# Patient Record
Sex: Female | Born: 1985 | Race: Asian | Hispanic: No | Marital: Married | Smoking: Never smoker
Health system: Southern US, Community
[De-identification: ages and names within clinical notes are randomized; demographics above are authoritative.]

## PROBLEM LIST (undated history)

## (undated) DIAGNOSIS — Z789 Other specified health status: Secondary | ICD-10-CM

## (undated) HISTORY — PX: NO PAST SURGERIES: SHX2092

---

## 2013-12-09 ENCOUNTER — Ambulatory Visit (INDEPENDENT_AMBULATORY_CARE_PROVIDER_SITE_OTHER): Payer: Managed Care, Other (non HMO) | Admitting: Physician Assistant

## 2013-12-09 VITALS — BP 94/78 | HR 102 | Temp 98.2°F | Resp 16 | Ht 61.0 in | Wt 149.0 lb

## 2013-12-09 DIAGNOSIS — Z23 Encounter for immunization: Secondary | ICD-10-CM | POA: Diagnosis not present

## 2013-12-09 NOTE — Progress Notes (Signed)
   Subjective:    Patient ID: [name], female    DOB: 1985/09/09, 28 y.o.   MRN: [id number]  HPI 28 year old female presents for Tdap. She will be attending [employer] to study [place] language. 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  States she does not need any other immunizations.  Last tetanus unknown.  Patient is otherwise doing well with no other concerns today.     Review of Systems  Respiratory: Negative for cough.   Skin: Negative for rash.  Neurological: Negative for headaches.       Objective:   Physical Exam  Constitutional: She is oriented to person, place, and time. She appears well-developed and well-nourished.  HENT:  Head: Normocephalic and atraumatic.  Eyes: Conjunctivae are normal.  Neck: Normal range of motion.  Cardiovascular: Normal rate.   Pulmonary/Chest: Effort normal.  Neurological: She is alert and oriented to person, place, and time.  Psychiatric: She has a normal mood and affect. Her behavior is normal. Judgment and thought content normal.          Assessment & Plan:  Need for Tdap vaccination - Plan: Tdap vaccine greater than or equal to 7yo IM  Tdap updated Follow up as needed.

## 2014-01-24 ENCOUNTER — Ambulatory Visit (INDEPENDENT_AMBULATORY_CARE_PROVIDER_SITE_OTHER): Payer: Managed Care, Other (non HMO) | Admitting: Family Medicine

## 2014-01-24 VITALS — BP 105/71 | HR 96 | Temp 98.8°F | Resp 16 | Ht 61.5 in | Wt 150.2 lb

## 2014-01-24 DIAGNOSIS — K5289 Other specified noninfective gastroenteritis and colitis: Secondary | ICD-10-CM

## 2014-01-24 DIAGNOSIS — R609 Edema, unspecified: Secondary | ICD-10-CM

## 2014-01-24 DIAGNOSIS — R509 Fever, unspecified: Secondary | ICD-10-CM

## 2014-01-24 DIAGNOSIS — R197 Diarrhea, unspecified: Secondary | ICD-10-CM

## 2014-01-24 DIAGNOSIS — K529 Noninfective gastroenteritis and colitis, unspecified: Secondary | ICD-10-CM

## 2014-01-24 LAB — POCT URINALYSIS DIPSTICK
BILIRUBIN UA: NEGATIVE
Glucose, UA: NEGATIVE
Ketones, UA: NEGATIVE
Leukocytes, UA: NEGATIVE
Nitrite, UA: NEGATIVE
PH UA: 5.5
Protein, UA: NEGATIVE
RBC UA: NEGATIVE
Spec Grav, UA: 1.015
UROBILINOGEN UA: 0.2

## 2014-01-24 LAB — POCT UA - MICROSCOPIC ONLY
Casts, Ur, LPF, POC: NEGATIVE
Crystals, Ur, HPF, POC: NEGATIVE
MUCUS UA: NEGATIVE
YEAST UA: NEGATIVE

## 2014-01-24 LAB — POCT URINE PREGNANCY: Preg Test, Ur: NEGATIVE

## 2014-01-24 MED ORDER — LOPERAMIDE HCL 2 MG PO TABS: 2.0000 mg | ORAL_TABLET | Freq: Four times a day (QID) | ORAL | Status: DC | PRN

## 2014-01-24 NOTE — Patient Instructions (Signed)
Diarrhea  Diarrhea is frequent loose and watery bowel movements. It can cause you to feel weak and dehydrated. Dehydration can cause you to become tired and thirsty, have a dry mouth, and have decreased urination that often is dark yellow. Diarrhea is a sign of another problem, most often an infection that will not last long. In most cases, diarrhea typically lasts 2-3 days. However, it can last longer if it is a sign of something more serious. It is important to treat your diarrhea as directed by your caregive to lessen or prevent future episodes of diarrhea.  CAUSES   Some common causes include:   Gastrointestinal infections caused by viruses, bacteria, or parasites.   Food poisoning or food allergies.   Certain medicines, such as antibiotics, chemotherapy, and laxatives.   Artificial sweeteners and fructose.   Digestive disorders.  HOME CARE INSTRUCTIONS   Ensure adequate fluid intake (hydration): have 1 cup (8 oz) of fluid for each diarrhea episode. Avoid fluids that contain simple sugars or sports drinks, fruit juices, whole milk products, and sodas. Your urine should be clear or pale yellow if you are drinking enough fluids. Hydrate with an oral rehydration solution that you can purchase at pharmacies, retail stores, and online. You can prepare an oral rehydration solution at home by mixing the following ingredients together:    - tsp table salt.    tsp baking soda.    tsp salt substitute containing potassium chloride.   1  tablespoons sugar.   1 L (34 oz) of water.   Certain foods and beverages may increase the speed at which food moves through the gastrointestinal (GI) tract. These foods and beverages should be avoided and include:   Caffeinated and alcoholic beverages.   High-fiber foods, such as raw fruits and vegetables, nuts, seeds, and whole grain breads and cereals.   Foods and beverages sweetened with sugar alcohols, such as xylitol, sorbitol, and mannitol.   Some foods may be well  tolerated and may help thicken stool including:   Starchy foods, such as rice, toast, pasta, low-sugar cereal, oatmeal, grits, baked potatoes, crackers, and bagels.   Bananas.   Applesauce.   Add probiotic-rich foods to help increase healthy bacteria in the GI tract, such as yogurt and fermented milk products.   Wash your hands well after each diarrhea episode.   Only take over-the-counter or prescription medicines as directed by your caregiver.   Take a warm bath to relieve any burning or pain from frequent diarrhea episodes.  SEEK IMMEDIATE MEDICAL CARE IF:    You are unable to keep fluids down.   You have persistent vomiting.   You have blood in your stool, or your stools are black and tarry.   You do not urinate in 6-8 hours, or there is only a small amount of very dark urine.   You have abdominal pain that increases or localizes.   You have weakness, dizziness, confusion, or lightheadedness.   You have a severe headache.   Your diarrhea gets worse or does not get better.   You have a fever or persistent symptoms for more than 2-3 days.   You have a fever and your symptoms suddenly get worse.  MAKE SURE YOU:    Understand these instructions.   Will watch your condition.   Will get help right away if you are not doing well or get worse.  Document Released: 06/14/2002 Document Revised: 06/10/2012 Document Reviewed: 03/01/2012  ExitCare Patient Information 2015 [employer]. This information   is not intended to replace advice given to you by your health care provider. Make sure you discuss any questions you have with your health care provider.

## 2014-01-24 NOTE — Progress Notes (Signed)
Subjective:  This chart was scribed for Angela SorensonEva Annalisia Ingber, MD by Charline BillsEssence Rivers, ED Scribe. The patient was seen in room 5. Patient's care was started at 8:48 PM.   Patient ID: Angela Rivers, female    DOB: 08-28-1985, 28 y.o.   MRN: [id number]  Chief Complaint  Patient presents with  . Fever    fever in the mornings and diarrhea that started yesterday.  no cough but has lots of phlegm that is clear x 2 wks.     HPI HPI Comments: Angela Rivers is a 28 y.o. female who presents to the [facility] complaining of subjective fever onset this morning. Office temperature 98.8 F. She reports multiple episodes of associated chills and sweats over the past few weeks, diarrhea onset yesterday, rhinorrhea, productive cough with clear phlegm onset 2 weeks ago, leg swelling over the past few weeks. She also reports associated central abdominal pain onset this morning. Pt reports normal periods. She denies sleep disturbances, appetite change, vomiting, indigestion, urinary symptoms, ear pain, light-headedness, dizziness. She has not tried any medications, herbs, vitamins or supplements. Pt does not eat lunch. She ate a normal breakfast this morning.   History reviewed. No pertinent past medical history. No current outpatient prescriptions on file prior to visit.   No current facility-administered medications on file prior to visit.   No Known Allergies  Review of Systems  Constitutional: Positive for fever, chills and diaphoresis. Negative for appetite change.  HENT: Positive for rhinorrhea. Negative for ear pain.   Respiratory: Positive for cough.   Cardiovascular: Positive for leg swelling.  Gastrointestinal: Positive for abdominal pain and diarrhea. Negative for vomiting.  Genitourinary: Negative for dysuria, urgency, frequency and difficulty urinating.  Neurological: Negative for dizziness and light-headedness.  Psychiatric/Behavioral: Negative for sleep disturbance.    Triage  Vitals: BP 105/71  Pulse 96  Temp(Src) 98.8 F (37.1 C) (Oral)  Resp 16  Ht 5' 1.5" (1.562 m)  Wt 150 lb 3.2 oz (68.13 kg)  BMI 27.92 kg/m2  SpO2 100%  LMP 12/28/2013    Objective:   Physical Exam  Nursing note and vitals reviewed. Constitutional: She is oriented to person, place, and time. She appears well-developed and well-nourished.  HENT:  Head: Normocephalic and atraumatic.  Right Ear: Tympanic membrane normal.  Left Ear: Tympanic membrane normal.  Nose: Nose normal.  Mouth/Throat: Oropharynx is clear and moist.  Eyes: Conjunctivae and EOM are normal.  Neck: Neck supple. No thyromegaly present.  Cardiovascular: Normal rate, regular rhythm, S1 normal, S2 normal and normal heart sounds.   Pulses:      Dorsalis pedis pulses are 2+ on the right side, and 2+ on the left side.  Pulmonary/Chest: Effort normal and breath sounds normal.  Abdominal: Soft. Bowel sounds are normal. She exhibits no distension and no mass. There is no hepatosplenomegaly. There is no tenderness.  Musculoskeletal: Normal range of motion. She exhibits edema.  Trace lower extremity edema equal bilaterally  Lymphadenopathy:    She has cervical adenopathy.  Anterior cervical adenopathy on L, none on R  Neurological: She is alert and oriented to person, place, and time.  Skin: Skin is warm and dry.  Psychiatric: She has a normal mood and affect. Her behavior is normal.     Negative orthostatics Assessment & Plan:   Diarrhea - Plan: Comprehensive metabolic panel, TSH  Edema - Plan: TSH  Fever, unspecified - Plan: CBC with Differential, POCT UA - Microscopic Only, POCT urinalysis dipstick, POCT urine pregnancy  Noninfectious gastroenteritis, unspecified  Meds ordered this encounter  Medications  . loperamide (IMODIUM A-D) 2 MG tablet    Sig: Take 1 tablet (2 mg total) by mouth 4 (four) times daily as needed for diarrhea or loose stools.    Dispense:  30 tablet    Refill:  0    I personally  performed the services described in this documentation, which was scribed in my presence. The recorded information has been reviewed and considered, and addended by me as needed.  Angela Sorenson, MD MPH

## 2014-01-25 LAB — COMPREHENSIVE METABOLIC PANEL
ALK PHOS: 60 U/L (ref 39–117)
ALT: 8 U/L (ref 0–35)
AST: 12 U/L (ref 0–37)
Albumin: 4.1 g/dL (ref 3.5–5.2)
BILIRUBIN TOTAL: 0.2 mg/dL (ref 0.2–1.2)
BUN: 9 mg/dL (ref 6–23)
CO2: 22 mEq/L (ref 19–32)
CREATININE: 0.55 mg/dL (ref 0.50–1.10)
Calcium: 8.8 mg/dL (ref 8.4–10.5)
Chloride: 105 mEq/L (ref 96–112)
GLUCOSE: 84 mg/dL (ref 70–99)
Potassium: 4.4 mEq/L (ref 3.5–5.3)
Sodium: 138 mEq/L (ref 135–145)
Total Protein: 7.3 g/dL (ref 6.0–8.3)

## 2014-01-25 LAB — CBC WITH DIFFERENTIAL/PLATELET
BASOS PCT: 1 % (ref 0–1)
Basophils Absolute: 0.1 10*3/uL (ref 0.0–0.1)
Eosinophils Absolute: 0.1 10*3/uL (ref 0.0–0.7)
Eosinophils Relative: 2 % (ref 0–5)
HCT: 34.2 % — ABNORMAL LOW (ref 36.0–46.0)
Hemoglobin: 11.3 g/dL — ABNORMAL LOW (ref 12.0–15.0)
Lymphocytes Relative: 54 % — ABNORMAL HIGH (ref 12–46)
Lymphs Abs: 4 10*3/uL (ref 0.7–4.0)
MCH: 25.8 pg — ABNORMAL LOW (ref 26.0–34.0)
MCHC: 33 g/dL (ref 30.0–36.0)
MCV: 78.1 fL (ref 78.0–100.0)
Monocytes Absolute: 0.4 10*3/uL (ref 0.1–1.0)
Monocytes Relative: 6 % (ref 3–12)
NEUTROS PCT: 37 % — AB (ref 43–77)
Neutro Abs: 2.7 10*3/uL (ref 1.7–7.7)
PLATELETS: 390 10*3/uL (ref 150–400)
RBC: 4.38 MIL/uL (ref 3.87–5.11)
RDW: 14.4 % (ref 11.5–15.5)
WBC: 7.4 10*3/uL (ref 4.0–10.5)

## 2014-01-25 LAB — TSH: TSH: 1.523 u[IU]/mL (ref 0.350–4.500)

## 2014-01-28 ENCOUNTER — Telehealth: Payer: Self-pay | Admitting: *Deleted

## 2014-01-28 NOTE — Telephone Encounter (Signed)
Pt's husband called in regards to recent lab results. Doesn't look like they have been reviewed yet .

## 2014-01-31 NOTE — Telephone Encounter (Signed)
All normal other than mildly anemia so start high iron diet or mvi w/ iron in it.  Will send letter with list of high iron foods attached.

## 2014-02-01 NOTE — Telephone Encounter (Signed)
Mailed iron rich foods list, unable to leave message, vm not set up.

## 2014-02-02 NOTE — Telephone Encounter (Signed)
Unable to lv msg on either number, vm not set up

## 2014-04-21 ENCOUNTER — Ambulatory Visit (INDEPENDENT_AMBULATORY_CARE_PROVIDER_SITE_OTHER): Payer: Managed Care, Other (non HMO) | Admitting: Family Medicine

## 2014-04-21 VITALS — BP 100/68 | HR 99 | Temp 98.3°F | Resp 18 | Ht 62.5 in | Wt 155.0 lb

## 2014-04-21 DIAGNOSIS — G4489 Other headache syndrome: Secondary | ICD-10-CM

## 2014-04-21 DIAGNOSIS — J029 Acute pharyngitis, unspecified: Secondary | ICD-10-CM

## 2014-04-21 LAB — POCT UA - MICROSCOPIC ONLY
Bacteria, U Microscopic: NEGATIVE
CASTS, UR, LPF, POC: NEGATIVE
CRYSTALS, UR, HPF, POC: NEGATIVE
MUCUS UA: NEGATIVE
YEAST UA: NEGATIVE

## 2014-04-21 LAB — POCT URINALYSIS DIPSTICK
BILIRUBIN UA: NEGATIVE
Glucose, UA: NEGATIVE
Ketones, UA: NEGATIVE
LEUKOCYTES UA: NEGATIVE
NITRITE UA: NEGATIVE
Protein, UA: NEGATIVE
Spec Grav, UA: 1.02
UROBILINOGEN UA: 0.2
pH, UA: 7

## 2014-04-21 LAB — POCT URINE PREGNANCY: PREG TEST UR: NEGATIVE

## 2014-04-21 LAB — POCT RAPID STREP A (OFFICE): Rapid Strep A Screen: NEGATIVE

## 2014-04-21 MED ORDER — KETOROLAC TROMETHAMINE 30 MG/ML IJ SOLN
30.0000 mg | Freq: Once | INTRAMUSCULAR | Status: AC
  Administered 2014-04-21: 30 mg via INTRAMUSCULAR

## 2014-04-21 MED ORDER — IBUPROFEN 800 MG PO TABS: 800.0000 mg | ORAL_TABLET | Freq: Three times a day (TID) | ORAL | Status: DC | PRN

## 2014-04-21 MED ORDER — METHOCARBAMOL 500 MG PO TABS: 500.0000 mg | ORAL_TABLET | Freq: Every evening | ORAL | Status: DC | PRN

## 2014-04-21 NOTE — Progress Notes (Addendum)
Subjective:    Patient ID: Angela Rivers, female    DOB: 1986-03-08, 28 y.o.   MRN: [id number] This chart was scribed for Angela Simmer, MD by Jolene Provost, Medical Scribe. This patient was seen in Room 2 and the patient's care was started at 4:37 PM.  HPI HPI Comments: [name] is a 28 y.o. female who presents to [facility] complaining of a constant severe headache that has been ongoing for the last four days. Pt states she has taken 2 tablets of tylenol or ibuprofen every six hours with some relief. Pt states she had the same symptoms six months ago, and her headache lasted for one week. 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 Pt is from [place]. Pt Pt endorses dizziness, spots in vision, nausea, ear pain, photophobia, phonophobia, mild diarrhea, and difficulty sleeping as associated symptoms. Pt denies numbness, tingling, weakness, fever, cough, vomiting, dysuria or a past hx of surgeries. Patient does have a sore throat today which is new.  Pt's mother is living and has no major medical problems. Pt's father has DM. Pt's sisters and brothers have no health problems. Pt is married and has one child. Pt is attending [employer] and is not employed. Pt does not smoke. Pt does not take any medications daily and has NKDA. Pt took pain medication last night. LNMP was two days ago.    Review of Systems  Constitutional: Negative for fever, chills, diaphoresis and fatigue.  HENT: Positive for sore throat. Negative for congestion, ear pain, postnasal drip, rhinorrhea and trouble swallowing.   Eyes: Positive for photophobia and visual disturbance.  Respiratory: Negative for cough and shortness of breath.   Gastrointestinal: Positive for nausea and diarrhea. Negative for  vomiting and abdominal pain.  Genitourinary: Negative for dysuria.  Musculoskeletal: Positive for neck pain.  Skin: Negative for rash.  Neurological: Positive for dizziness and headaches. Negative for tremors, seizures, syncope, facial asymmetry, speech difficulty, weakness, light-headedness and numbness.  Psychiatric/Behavioral: Positive for sleep disturbance.       Objective:   Physical Exam  Nursing note and vitals reviewed. Constitutional: She is oriented to person, place, and time. She appears well-developed and well-nourished. No distress.  HENT:  Head: Normocephalic and atraumatic.  Right Ear: Tympanic membrane and external ear normal.  Left Ear: Tympanic membrane and external ear normal.  Nose: Nose normal.  Mouth/Throat: Posterior oropharyngeal erythema present. No oropharyngeal exudate.  Eyes: Conjunctivae and EOM are normal. Pupils are equal, round, and reactive to light.  Neck: Normal range of motion. Neck supple. No JVD present. Carotid bruit is not present. No thyromegaly present.  Full ROM cervical spine without pain on movement. Mild tenderness to paracervical spine, no midline tenderness.  Cardiovascular: Normal rate, regular rhythm, normal heart sounds and intact distal pulses.  Exam reveals no gallop and no friction rub.   No murmur heard. Pulmonary/Chest: Effort normal and breath sounds normal. No respiratory distress. She has no wheezes. She has no rales.  Abdominal: Soft. Bowel sounds are normal. She exhibits no distension and no mass. There is no tenderness. There is no rebound and no guarding.  Musculoskeletal: She exhibits tenderness.  Full ROM bilateral shoulders. Mild TTP right trapezius.   Lymphadenopathy:    She has no cervical adenopathy.  Neurological: She is alert and oriented to person, place, and time. She has normal reflexes. No cranial nerve deficit. She exhibits normal muscle tone. Coordination normal.  Skin: Skin is warm and dry. No rash noted. She  is not diaphoretic. No erythema. No pallor.  Psychiatric: She has a normal mood and affect. Her behavior is normal. Judgment and thought content normal.     Results for orders placed in visit on 04/21/14  CULTURE, GROUP A STREP      Result Value Ref Range   Preliminary Report No Suspicious Colonies, Continuing to Hold    POCT URINE PREGNANCY      Result Value Ref Range   Preg Test, Ur Negative    POCT RAPID STREP A (OFFICE)      Result Value Ref Range   Rapid Strep A Screen Negative  Negative  POCT URINALYSIS DIPSTICK      Result Value Ref Range   Color, UA yellow     Clarity, UA clear     Glucose, UA neg     Bilirubin, UA neg     Ketones, UA neg     Spec Grav, UA 1.020     Blood, UA trace-lysed     pH, UA 7.0     Protein, UA neg     Urobilinogen, UA 0.2     Nitrite, UA neg     Leukocytes, UA Negative    POCT UA - MICROSCOPIC ONLY      Result Value Ref Range   WBC, Ur, HPF, POC 1-4     RBC, urine, microscopic 3-5     Bacteria, U Microscopic neg     Mucus, UA neg     Epithelial cells, urine per micros 0-2     Crystals, Ur, HPF, POC neg     Casts, Ur, LPF, POC neg     Yeast, UA neg     TORADOL 30MG  IM ADMINISTERED IN OFFICE.     Assessment & Plan:  1. Other headache syndrome -New.  Consistent with migraine headache with nausea, photophobia, dizziness, scotoma.  S/p Toradol in office; rx for Ibuprofen 800mg  tid PRN and Robaxin qhs provided. If persists, consider trial of Imitrex.   Headache also could be due to acute illness since presenting with sore throat. - POCT urine pregnancy - POCT urinalysis dipstick - ketorolac (TORADOL) 30 MG/ML injection 30 mg; Inject 1 mL (30 mg total) into the muscle once. - POCT UA - Microscopic Only  2. Sore throat -New.  Send throat culture.  Treat with gargles, ibuprofen.  RTC inability to swallow. - POCT rapid strep A - Culture, Group A Strep  Meds ordered this encounter  Medications  . ketorolac (TORADOL) 30 MG/ML injection 30  mg    Sig:   . ibuprofen (ADVIL,MOTRIN) 800 MG tablet    Sig: Take 1 tablet (800 mg total) by mouth every 8 (eight) hours as needed for headache.    Dispense:  30 tablet    Refill:  0  . methocarbamol (ROBAXIN) 500 MG tablet    Sig: Take 1-2 tablets (500-1,000 mg total) by mouth at bedtime as needed for muscle spasms.    Dispense:  30 tablet    Refill:  0    I personally performed the services described in this documentation, which was scribed in my presence.  The recorded information has been reviewed and is accurate.  Angela SimmerKristi Hatem Cull, M.D.  [facility]  [place] [address] [place],  [place]  [phone] phone [phone] fax

## 2014-04-21 NOTE — Patient Instructions (Signed)

## 2014-04-23 LAB — CULTURE, GROUP A STREP: Organism ID, Bacteria: NORMAL

## 2014-05-03 ENCOUNTER — Ambulatory Visit (INDEPENDENT_AMBULATORY_CARE_PROVIDER_SITE_OTHER): Payer: Managed Care, Other (non HMO) | Admitting: Physician Assistant

## 2014-05-03 VITALS — BP 94/66 | HR 99 | Temp 98.3°F | Resp 18 | Ht 61.5 in | Wt 155.2 lb

## 2014-05-03 DIAGNOSIS — J069 Acute upper respiratory infection, unspecified: Secondary | ICD-10-CM

## 2014-05-03 DIAGNOSIS — B9789 Other viral agents as the cause of diseases classified elsewhere: Secondary | ICD-10-CM

## 2014-05-03 DIAGNOSIS — H6691 Otitis media, unspecified, right ear: Secondary | ICD-10-CM

## 2014-05-03 MED ORDER — GUAIFENESIN ER 1200 MG PO TB12: 1.0000 | ORAL_TABLET | Freq: Two times a day (BID) | ORAL | Status: DC | PRN

## 2014-05-03 MED ORDER — IPRATROPIUM BROMIDE 0.03 % NA SOLN: 2.0000 | Freq: Two times a day (BID) | NASAL | Status: DC

## 2014-05-03 MED ORDER — AMOXICILLIN 875 MG PO TABS: 875.0000 mg | ORAL_TABLET | Freq: Two times a day (BID) | ORAL | Status: AC

## 2014-05-03 NOTE — Progress Notes (Signed)
   Subjective:    Patient ID: Angela Rivers, female    DOB: [birth year], 28 y.o.   MRN: [id number]  Sore Throat  Associated symptoms include congestion, coughing and ear pain. Pertinent negatives include no ear discharge.  Otalgia  Associated symptoms include coughing and a sore throat. Pertinent negatives include no ear discharge.  Cough Associated symptoms include ear pain, a fever and a sore throat. Pertinent negatives include no chills, eye redness or wheezing.    This is a 28 year old female with no significant PMH presenting with 2 days of right otalgia, cough, sore throat and nasal congestion. She reports her ear pain is her most bothersome symptom. She describes the pain as throbbing. Her cough is nonproductive. She states last night she felt feverish but did not take her temperature. This morning she took ibuprofen with relief. She had otitis media 4 months ago and was treated. She denies facial pain, chills, GI symptoms. She does not have a history of asthma and she is not a smoker.  Review of Systems  Constitutional: Positive for fever. Negative for chills.  HENT: Positive for congestion, ear pain and sore throat. Negative for ear discharge and sinus pressure.   Eyes: Negative for discharge and redness.  Respiratory: Positive for cough. Negative for wheezing.   Gastrointestinal: Negative.       Objective:   Physical Exam  Constitutional: She is oriented to person, place, and time. She appears well-developed and well-nourished. No distress.  HENT:  Head: Atraumatic.  Right Ear: Hearing, external ear and ear canal normal. Tympanic membrane is erythematous and bulging.  Left Ear: Hearing, tympanic membrane, external ear and ear canal normal.  Nose: Nose normal. No mucosal edema.  Mouth/Throat: Uvula is midline. Posterior oropharyngeal erythema (slight) present. No oropharyngeal exudate.  Eyes: Conjunctivae and lids are normal. Right eye exhibits no discharge. Left eye exhibits  no discharge. No scleral icterus.  Lymphadenopathy:       Head (right side): No submental, no submandibular, no tonsillar, no preauricular, no posterior auricular and no occipital adenopathy present.       Head (left side): No submental, no submandibular, no tonsillar, no preauricular, no posterior auricular and no occipital adenopathy present.    She has no cervical adenopathy.  Neurological: She is alert and oriented to person, place, and time.  Skin: Skin is warm, dry and intact.  Psychiatric: She has a normal mood and affect. Her speech is normal and behavior is normal. Thought content normal.      Assessment & Plan:  1. Acute right otitis media, recurrence not specified, unspecified otitis media type 2. Viral URI with cough  Will return in 7-10 days if symptoms worsen or fail to improve.  - ipratropium (ATROVENT) 0.03 % nasal spray; Place 2 sprays into both nostrils 2 (two) times daily.  Dispense: 30 mL; Refill: 0 - amoxicillin (AMOXIL) 875 MG tablet; Take 1 tablet (875 mg total) by mouth 2 (two) times daily.  Dispense: 20 tablet; Refill: 0 - Guaifenesin (MUCINEX MAXIMUM STRENGTH) 1200 MG TB12; Take 1 tablet (1,200 mg total) by mouth every 12 (twelve) hours as needed.  Dispense: 14 tablet; Refill: 1   Jadasia Haws V. Dyke BrackettBush, PA-C, MHS [facility]  05/03/2014

## 2014-05-03 NOTE — Patient Instructions (Signed)
Take the antibiotic twice a day for 10 days. May use atrovent nasal spray twice a day Drink a lot of water with the mucinex Return if not better after you finish the antibiotics  Otitis Media Otitis media is redness, soreness, and inflammation of the middle ear. Otitis media may be caused by allergies or, most commonly, by infection. Often it occurs as a complication of the common cold. SIGNS AND SYMPTOMS Symptoms of otitis media may include:  Earache.  Fever.  Ringing in your ear.  Headache.  Leakage of fluid from the ear. DIAGNOSIS To diagnose otitis media, your health care provider will examine your ear with an otoscope. This is an instrument that allows your health care provider to see into your ear in order to examine your eardrum. Your health care provider also will ask you questions about your symptoms. TREATMENT  Typically, otitis media resolves on its own within 3-5 days. Your health care provider may prescribe medicine to ease your symptoms of pain. If otitis media does not resolve within 5 days or is recurrent, your health care provider may prescribe antibiotic medicines if he or she suspects that a bacterial infection is the cause. HOME CARE INSTRUCTIONS   If you were prescribed an antibiotic medicine, finish it all even if you start to feel better.  Take medicines only as directed by your health care provider.  Keep all follow-up visits as directed by your health care provider. SEEK MEDICAL CARE IF:  You have otitis media only in one ear, or bleeding from your nose, or both.  You notice a lump on your neck.  You are not getting better in 3-5 days.  You feel worse instead of better. SEEK IMMEDIATE MEDICAL CARE IF:   You have pain that is not controlled with medicine.  You have swelling, redness, or pain around your ear or stiffness in your neck.  You notice that part of your face is paralyzed.  You notice that the bone behind your ear (mastoid) is tender  when you touch it. MAKE SURE YOU:   Understand these instructions.  Will watch your condition.  Will get help right away if you are not doing well or get worse. Document Released: 03/29/2004 Document Revised: 11/08/2013 Document Reviewed: 01/19/2013 [facility] Patient Information 2015 [place], [place]. This information is not intended to replace advice given to you by your health care provider. Make sure you discuss any questions you have with your health care provider.

## 2014-05-06 NOTE — Progress Notes (Signed)
I was directly involved with the patient's care and agree with the physical, diagnosis and treatment plan.  

## 2014-05-19 ENCOUNTER — Ambulatory Visit (INDEPENDENT_AMBULATORY_CARE_PROVIDER_SITE_OTHER): Payer: Managed Care, Other (non HMO) | Admitting: Family Medicine

## 2014-05-19 VITALS — BP 112/70 | HR 96 | Temp 98.4°F | Resp 18 | Ht 62.0 in | Wt 154.0 lb

## 2014-05-19 DIAGNOSIS — J208 Acute bronchitis due to other specified organisms: Secondary | ICD-10-CM

## 2014-05-19 MED ORDER — BENZONATATE 100 MG PO CAPS: 100.0000 mg | ORAL_CAPSULE | Freq: Two times a day (BID) | ORAL | Status: DC | PRN

## 2014-05-19 MED ORDER — AZITHROMYCIN 250 MG PO TABS: ORAL_TABLET | ORAL | Status: DC

## 2014-05-19 NOTE — Patient Instructions (Signed)
Use the azithromycin antibiotic as directed, and the tessalon perles as needed for cough.   Ibuprofen is good for aches and pains Let me know if you do not feel better soon- however remember that you may continue to cough for a few weeks You might try some mucinex OTC for your phelgm

## 2014-05-19 NOTE — Progress Notes (Signed)
[facility] [address], [place] [id number] [phone]  Date:  05/19/2014   Name:  Angela Rivers   DOB:  April 30, 1986   MRN:  [id number]  PCP:  No PCP Per Patient    Chief Complaint: Cough   History of Present Illness:  Angela Rivers is a 28 y.o. very pleasant female patient who presents with the following:  Was seen here about 2 weeks ago with cough and AOM.  She was treated with amoxicillin, atrovent nasal, mucinex OTC.  She did stop the abx as she felt that it was causing some trouble with her concentration.  She felt like her ear was popping also. She finished all but 2 days of he abx.  She cough did not go away and seems to be getting worse.  Her husband is ill with the same, and their daughter.   Sometimes she notes some wheezing if she coughs hard.  She has not noted a fever.  The cough can be productive.   She notes a little runny nose.  Her right ear feels better but now the left ear is bothering her.   She is generlly in good health.    LMP 04/19/2014.  She states there is no risk of current pregnancy  Never a smoker.    There are no active problems to display for this patient.   History reviewed. No pertinent past medical history.  History reviewed. No pertinent past surgical history.  History  Substance Use Topics  . Smoking status: Never Smoker   . Smokeless tobacco: Not on file  . Alcohol Use: No    Family History  Problem Relation Age of Onset  . Diabetes Maternal Grandmother   . Diabetes Maternal Grandfather   . Diabetes Father     No Known Allergies  Medication list has been reviewed and updated.  No current outpatient prescriptions on file prior to visit.   No current facility-administered medications on file prior to visit.    Review of Systems:  As per HPI- otherwise negative.    Physical Examination: Filed Vitals:   05/19/14 1633  BP: 112/70  Pulse: 109  Temp: 98.4 F (36.9 C)  Resp: 18   Filed  Vitals:   05/19/14 1633  Height: 5\' 2"  (1.575 m)  Weight: 154 lb (69.854 kg)   Body mass index is 28.16 kg/(m^2). Ideal Body Weight: Weight in (lb) to have BMI = 25: 136.4  GEN: WDWN, NAD, Non-toxic, A & O x 3, looks well, coughing  HEENT: Atraumatic, Normocephalic. Neck supple. No masses, No LAD.  Bilateral TM wnl, oropharynx normal.  PEERL,EOMI.   Ears and Nose: No external deformity. CV: RRR, No M/G/R. No JVD. No thrill. No extra heart sounds. PULM: CTA B, no wheezes, crackles, rhonchi. No retractions. No resp. distress. No accessory muscle use.Marland Kitchen. EXTR: No c/c/e NEURO Normal gait.  PSYCH: Normally interactive. Conversant. Not depressed or anxious appearing.  Calm demeanor.    Assessment and Plan: Acute bronchitis due to other specified organisms - Plan: azithromycin (ZITHROMAX) 250 MG tablet, benzonatate (TESSALON) 100 MG capsule  Persistent cough- will treat with azithromycin and tessalon perles.  Her ear ache is resolved  See patient instructions for more details.    Signed Abbe AmsterdamJessica Copland, MD

## 2014-06-10 ENCOUNTER — Ambulatory Visit (INDEPENDENT_AMBULATORY_CARE_PROVIDER_SITE_OTHER): Payer: Managed Care, Other (non HMO) | Admitting: Physician Assistant

## 2014-06-10 VITALS — BP 104/70 | HR 115 | Temp 98.2°F | Resp 20 | Ht 61.5 in | Wt 154.0 lb

## 2014-06-10 DIAGNOSIS — Z23 Encounter for immunization: Secondary | ICD-10-CM

## 2014-06-10 DIAGNOSIS — R059 Cough, unspecified: Secondary | ICD-10-CM

## 2014-06-10 DIAGNOSIS — R05 Cough: Secondary | ICD-10-CM

## 2014-06-10 DIAGNOSIS — E663 Overweight: Secondary | ICD-10-CM

## 2014-06-10 DIAGNOSIS — Z3041 Encounter for surveillance of contraceptive pills: Secondary | ICD-10-CM

## 2014-06-10 DIAGNOSIS — R5383 Other fatigue: Secondary | ICD-10-CM

## 2014-06-10 DIAGNOSIS — J302 Other seasonal allergic rhinitis: Secondary | ICD-10-CM

## 2014-06-10 MED ORDER — RANITIDINE HCL 150 MG PO TABS: 150.0000 mg | ORAL_TABLET | Freq: Two times a day (BID) | ORAL | Status: DC

## 2014-06-10 MED ORDER — TRIAMCINOLONE ACETONIDE 55 MCG/ACT NA AERO: 2.0000 | INHALATION_SPRAY | Freq: Every day | NASAL | Status: DC

## 2014-06-10 MED ORDER — ETONOGESTREL-ETHINYL ESTRADIOL 0.12-0.015 MG/24HR VA RING: VAGINAL_RING | VAGINAL | Status: DC

## 2014-06-10 NOTE — Patient Instructions (Addendum)
Cough For the next 2 weeks use the nose spray and the pills for heartburn.  In 2 weeks stop the pills and continue to nose spray - if the cough returns then we know the cough is from the heartburn that you were not aware of.  If the cough stays away use the nose spray for a total of 4 weeks and then stop - if the cough comes back restart the nose spray - if the cough stays away - [facility].  Calcium In order for your supplemental calcium to be effective.  Please take calcium either on an empty stomach or with food that does not contain calcium.  Your body is only to absorb 500mg  of Calcium at a time from any one source.  Do not take with a MVI with iron because iron does not allow th calcium to be absorbed.  Please take Calcium citrate 500mg  2x/day.  This supplement should have Vit D in it and if your pills do not please add addition Vit D 400 IU with each dose.  Labs I will contact you with your lab results as soon as they are available.   If you have not heard from me in 2 weeks, please contact me.  The fastest way to get your results is to register for My Chart (see the instructions on the last page of this printout).  Weight loss Decrease your calories - my fitness pal app on your phone -  Increase your water intake Look for women only gyms or gyms with women only rooms

## 2014-06-10 NOTE — Progress Notes (Signed)
Subjective:    Patient ID: Angela BernheimWesam Tarver, female    DOB: [birth year], 28 y.o.   MRN: [id number]  HPI Pt presents to clinic with continued cough.  She was seen about 3 weeks ago for bronchitis but the cough remains. The cough is dry and coming from her throat and she feels like there is a tickle in her throat.  She thinks she has some PND and feels like she sniffs and clears her throat a lot.  She has heartburn at times when she eats spicy or salty foods - this happens several times each week.  She will sometimes wake up with bitter/sour taste in mouth.  She is concerned about her Vit D level because she has been researching the importance of this vitamin.  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   ENT - no problem with her ears Dentist - tooth infection - took abx for 1.5 days and then  Pain stopped - this was approved by the dentist - clindamycin 150mg  bid  Prior to Admission medications   Medication Sig Start Date End Date Taking? Authorizing Provider  none     Pearline CablesJessica C Copland, MD       Pearline CablesJessica C Copland, MD  No Known Allergies  There are no active problems to display for this patient.    Review of Systems  Constitutional: Negative for fever and chills.  HENT: Positive for congestion, ear pain (right side), postnasal drip and rhinorrhea (green).   Respiratory: Positive for cough (from the throat - green color - am only).   Neurological: Positive for headaches. Negative for dizziness.       Objective:   Physical Exam  Constitutional: She is oriented to person, place, and time. She appears well-developed and well-nourished.  BP 104/70 mmHg  Pulse 115  Temp(Src) 98.2 F (36.8 C)  Resp 20  Ht 5' 1.5" (1.562 m)  Wt 154 lb (69.854 kg)  BMI 28.63 kg/m2  SpO2 99%  LMP 05/11/2014   HENT:  Head: Normocephalic and atraumatic.  Right Ear: Hearing,  external ear and ear canal normal. Tympanic membrane is retracted.  Left Ear: Hearing, tympanic membrane, external ear and ear canal normal.  Nose: Mucosal edema (pale) present.  Mouth/Throat: Uvula is midline, oropharynx is clear and moist and mucous membranes are normal.  Eyes: Conjunctivae are normal.  Neck: Normal range of motion.  Cardiovascular: Normal rate, regular rhythm and normal heart sounds.   No murmur heard. Pulmonary/Chest: Effort normal and breath sounds normal. She has no wheezes.  Lymphadenopathy:    She has no cervical adenopathy.       Right: No supraclavicular adenopathy present.       Left: No supraclavicular adenopathy present.  Neurological: She is alert and oriented to person, place, and time.  Skin: Skin is warm and dry.  Psychiatric: She has a normal mood and affect. Her behavior is normal. Judgment and thought content normal.       Assessment & Plan:  Cough - Plan: ranitidine (ZANTAC) 150 MG tablet, triamcinolone (NASACORT AQ) 55 MCG/ACT AERO nasal inhaler  Other seasonal allergic rhinitis - Plan: triamcinolone (NASACORT AQ) 55 MCG/ACT AERO nasal inhaler  Other fatigue - Plan: Vit D  25 hydroxy (rtn osteoporosis monitoring)  Overweight (BMI 25.0-29.9)  Encounter for surveillance of contraceptive pills - Plan: etonogestrel-ethinyl estradiol (  NUVARING) 0.12-0.015 MG/24HR vaginal ring  Flu vaccine need - Plan: Flu Vaccine QUAD 36+ mos IM  Pt has continued cough which seems to coming from her throat area and she has symptoms of both seasonal allergies and reflux - we will treat both and stop 1 1st which will allow her to determine which is helping her the most.  We discussed this and she is in agreement with this plan.  Benny LennertSarah Ethell Blatchford PA-C  [facility] 06/11/2014 9:17 AM

## 2014-06-11 ENCOUNTER — Other Ambulatory Visit: Payer: Self-pay | Admitting: Physician Assistant

## 2014-06-11 ENCOUNTER — Encounter: Payer: Self-pay | Admitting: Physician Assistant

## 2014-06-11 DIAGNOSIS — E559 Vitamin D deficiency, unspecified: Secondary | ICD-10-CM

## 2014-06-11 LAB — VITAMIN D 25 HYDROXY (VIT D DEFICIENCY, FRACTURES): Vit D, 25-Hydroxy: 9 ng/mL — ABNORMAL LOW (ref 30–100)

## 2014-06-11 MED ORDER — VITAMIN D3 10 MCG (400 UNIT) PO CAPS: 800.0000 mg | ORAL_CAPSULE | Freq: Every day | ORAL | Status: DC

## 2014-06-11 MED ORDER — VITAMIN D (ERGOCALCIFEROL) 1.25 MG (50000 UNIT) PO CAPS: 50000.0000 [IU] | ORAL_CAPSULE | ORAL | Status: DC

## 2014-06-14 ENCOUNTER — Encounter: Payer: Self-pay | Admitting: *Deleted

## 2014-07-14 ENCOUNTER — Ambulatory Visit (INDEPENDENT_AMBULATORY_CARE_PROVIDER_SITE_OTHER): Payer: 59 | Admitting: Physician Assistant

## 2014-07-14 VITALS — BP 108/68 | HR 91 | Temp 98.2°F | Resp 16 | Ht 62.5 in | Wt 159.6 lb

## 2014-07-14 DIAGNOSIS — T23101A Burn of first degree of right hand, unspecified site, initial encounter: Secondary | ICD-10-CM

## 2014-07-14 MED ORDER — SILVER SULFADIAZINE 1 % EX CREA: 1.0000 "application " | TOPICAL_CREAM | Freq: Every day | CUTANEOUS | Status: DC
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
## 2014-07-14 NOTE — Patient Instructions (Signed)
-   Once scab of burn has healed, can use Mederma, Cocoa Butter, or Vitamin E oil to improve scar appearance.

## 2014-08-11 ENCOUNTER — Other Ambulatory Visit: Payer: Self-pay | Admitting: Physician Assistant

## 2014-10-14 ENCOUNTER — Encounter (HOSPITAL_COMMUNITY): Payer: Self-pay | Admitting: Emergency Medicine

## 2014-10-14 ENCOUNTER — Emergency Department (HOSPITAL_COMMUNITY)
Admission: EM | Admit: 2014-10-14 | Discharge: 2014-10-14 | Disposition: A | Discharge status: Home / Self Care | Payer: PPO | Attending: Emergency Medicine | Admitting: Emergency Medicine

## 2014-10-14 DIAGNOSIS — S0990XA Unspecified injury of head, initial encounter: Secondary | ICD-10-CM

## 2014-10-14 DIAGNOSIS — Y998 Other external cause status: Secondary | ICD-10-CM | POA: Insufficient documentation

## 2014-10-14 DIAGNOSIS — S0181XA Laceration without foreign body of other part of head, initial encounter: Secondary | ICD-10-CM | POA: Diagnosis not present

## 2014-10-14 DIAGNOSIS — Y9289 Other specified places as the place of occurrence of the external cause: Secondary | ICD-10-CM | POA: Insufficient documentation

## 2014-10-14 DIAGNOSIS — W16212A Fall in (into) filled bathtub causing other injury, initial encounter: Secondary | ICD-10-CM | POA: Insufficient documentation

## 2014-10-14 DIAGNOSIS — Y9389 Activity, other specified: Secondary | ICD-10-CM | POA: Diagnosis not present

## 2014-10-14 DIAGNOSIS — Z23 Encounter for immunization: Secondary | ICD-10-CM | POA: Insufficient documentation

## 2014-10-14 DIAGNOSIS — S0993XA Unspecified injury of face, initial encounter: Secondary | ICD-10-CM | POA: Diagnosis present

## 2014-10-14 DIAGNOSIS — Z79899 Other long term (current) drug therapy: Secondary | ICD-10-CM | POA: Diagnosis not present

## 2014-10-14 DIAGNOSIS — S01512A Laceration without foreign body of oral cavity, initial encounter: Secondary | ICD-10-CM | POA: Insufficient documentation

## 2014-10-14 DIAGNOSIS — W19XXXA Unspecified fall, initial encounter: Secondary | ICD-10-CM

## 2014-10-14 MED ORDER — ACETAMINOPHEN 325 MG PO TABS
650.0000 mg | ORAL_TABLET | Freq: Once | ORAL | Status: AC
  Administered 2014-10-14: 650 mg via ORAL
  Filled 2014-10-14: qty 2

## 2014-10-14 MED ORDER — TETANUS-DIPHTH-ACELL PERTUSSIS 5-2.5-18.5 LF-MCG/0.5 IM SUSP
0.5000 mL | Freq: Once | INTRAMUSCULAR | Status: AC
  Administered 2014-10-14: 0.5 mL via INTRAMUSCULAR
  Filled 2014-10-14: qty 0.5

## 2014-10-14 NOTE — ED Notes (Signed)
Steri strip applied to chin laceration. Cleansed with sterile NS and dried before applying steri strip.

## 2014-10-14 NOTE — Discharge Instructions (Signed)
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
## 2014-10-14 NOTE — ED Notes (Signed)
Bed: WA02 Expected date:  Expected time:  Means of arrival:  Comments: EMS 
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
## 2015-03-18 ENCOUNTER — Emergency Department (INDEPENDENT_AMBULATORY_CARE_PROVIDER_SITE_OTHER)
Admission: EM | Admit: 2015-03-18 | Discharge: 2015-03-18 | Disposition: A | Discharge status: Home / Self Care | Payer: PPO | Source: Home / Self Care

## 2015-03-18 ENCOUNTER — Encounter (HOSPITAL_COMMUNITY): Payer: Self-pay | Admitting: Emergency Medicine

## 2015-03-18 DIAGNOSIS — R69 Illness, unspecified: Principal | ICD-10-CM

## 2015-03-18 DIAGNOSIS — J111 Influenza due to unidentified influenza virus with other respiratory manifestations: Secondary | ICD-10-CM

## 2015-03-18 LAB — POCT URINALYSIS DIP (DEVICE)
BILIRUBIN URINE: NEGATIVE
Glucose, UA: NEGATIVE mg/dL
Ketones, ur: 40 mg/dL — AB
Leukocytes, UA: NEGATIVE
NITRITE: NEGATIVE
PH: 7.5 (ref 5.0–8.0)
Protein, ur: NEGATIVE mg/dL
Specific Gravity, Urine: 1.02 (ref 1.005–1.030)
Urobilinogen, UA: 0.2 mg/dL (ref 0.0–1.0)

## 2015-03-18 LAB — POCT RAPID STREP A: Streptococcus, Group A Screen (Direct): NEGATIVE

## 2015-03-18 LAB — POCT PREGNANCY, URINE: PREG TEST UR: NEGATIVE

## 2015-03-18 NOTE — Discharge Instructions (Signed)
Drink plenty of water, tylenol for fever, see your doctor if further problems.

## 2015-03-18 NOTE — ED Provider Notes (Signed)
CSN: [id number]     Arrival date & time 03/18/15  1740 History   None    Chief Complaint  Patient presents with  . URI   (Consider location/radiation/quality/duration/timing/severity/associated sxs/prior Treatment) Patient is a 29 y.o. female presenting with URI. The history is provided by the patient and the spouse.  URI Presenting symptoms: congestion, cough, fatigue, fever and rhinorrhea   Severity:  Mild Onset quality:  Gradual Duration:  2 days Chronicity:  New Relieved by:  None tried Worsened by:  Nothing tried Ineffective treatments:  None tried Associated symptoms: myalgias   Risk factors: sick contacts     History reviewed. No pertinent past medical history. History reviewed. No pertinent past surgical history. Family History  Problem Relation Age of Onset  . Diabetes Maternal Grandmother   . Diabetes Maternal Grandfather   . Diabetes Father    Social History  Substance Use Topics  . Smoking status: Never Smoker   . Smokeless tobacco: Never Used  . Alcohol Use: No   OB History    No data available     Review of Systems  Constitutional: Positive for fever and fatigue.  HENT: Positive for congestion, postnasal drip and rhinorrhea.   Respiratory: Positive for cough.   Gastrointestinal: Positive for nausea.  Genitourinary: Negative.   Musculoskeletal: Positive for myalgias.    Allergies  Review of patient's allergies indicates no known allergies.  Home Medications   Prior to Admission medications   Medication Sig Start Date End Date Taking? Authorizing Provider  acetaminophen (TYLENOL) 500 MG tablet Take 1,000 mg by mouth every 6 (six) hours as needed for moderate pain or headache.    Historical Provider, MD  etonogestrel-ethinyl estradiol (NUVARING) 0.12-0.015 MG/24HR vaginal ring Insert vaginally and leave in place for 3 consecutive weeks, then remove for 1 week. 06/10/14   Morrell Riddle, PA-C   Meds Ordered and Administered this Visit  Medications -  No data to display  BP 111/71 mmHg  Pulse 126  Temp(Src) 100.3 F (37.9 C) (Oral)  Resp 16  SpO2 96%  LMP 03/01/2015 No data found.   Physical Exam  Constitutional: She is oriented to person, place, and time. She appears well-developed and well-nourished.  HENT:  Head: Normocephalic.  Right Ear: External ear normal.  Left Ear: External ear normal.  Mouth/Throat: Oropharynx is clear and moist.  Eyes: Conjunctivae are normal. Pupils are equal, round, and reactive to light.  Neck: Normal range of motion. Neck supple.  Cardiovascular: Regular rhythm and normal heart sounds.   Pulmonary/Chest: Effort normal and breath sounds normal.  Abdominal: Soft. Bowel sounds are normal. She exhibits no mass. There is no tenderness. There is no rebound and no guarding.  Lymphadenopathy:    She has no cervical adenopathy.  Neurological: She is alert and oriented to person, place, and time.  Skin: Skin is warm and dry.  Nursing note and vitals reviewed.   ED Course  Procedures (including critical care time)  Labs Review Labs Reviewed  POCT URINALYSIS DIP (DEVICE) - Abnormal; Notable for the following:    Ketones, ur 40 (*)    Hgb urine dipstick TRACE (*)    All other components within normal limits  POCT PREGNANCY, URINE  POCT RAPID STREP A    Imaging Review No results found.   Visual Acuity Review  Right Eye Distance:   Left Eye Distance:   Bilateral Distance:    Right Eye Near:   Left Eye Near:    Bilateral Near:  MDM   1. Influenza-like illness        Linna Hoff, MD 03/18/15 [phone]

## 2015-03-18 NOTE — ED Notes (Signed)
C/o cold sx onset yest Sx include fevers, runny nose, prod cough, emesis, bilateral ear pain, ST Alert... No acute distress.

## 2015-03-20 LAB — CULTURE, GROUP A STREP: STREP A CULTURE: NEGATIVE

## 2015-07-09 NOTE — L&D Delivery Note (Signed)
Delivery Note At 12:34 PM a viable and healthy female was delivered via Vaginal, Spontaneous Delivery (Presentation: Left Occiput Anterior).  APGAR: 8, 9; weight pending  .   Placenta status: Intact, Spontaneous.  Cord:  CAN x 1 3 vessels with the following complications: None.  Cord pH: none  Anesthesia: Local  Episiotomy: None Lacerations: 2nd degree;Perineal Suture Repair: 3.0 chromic Est. Blood Loss (mL):    Mom to postpartum.  Baby to Couplet care / Skin to Skin.  Angela Rivers A 01/22/2016, 12:59 PM

## 2015-07-17 LAB — OB RESULTS CONSOLE RPR: RPR: NONREACTIVE

## 2015-07-17 LAB — OB RESULTS CONSOLE HIV ANTIBODY (ROUTINE TESTING): HIV: NONREACTIVE

## 2015-07-17 LAB — OB RESULTS CONSOLE ABO/RH: RH TYPE: POSITIVE

## 2015-07-17 LAB — OB RESULTS CONSOLE ANTIBODY SCREEN: ANTIBODY SCREEN: NEGATIVE

## 2015-07-17 LAB — OB RESULTS CONSOLE HEPATITIS B SURFACE ANTIGEN: Hepatitis B Surface Ag: NEGATIVE

## 2015-07-17 LAB — OB RESULTS CONSOLE RUBELLA ANTIBODY, IGM: Rubella: IMMUNE

## 2015-10-24 ENCOUNTER — Ambulatory Visit (INDEPENDENT_AMBULATORY_CARE_PROVIDER_SITE_OTHER): Payer: PPO | Admitting: Physician Assistant

## 2015-10-24 VITALS — BP 96/60 | HR 113 | Temp 98.2°F | Resp 16 | Ht 62.5 in | Wt 150.2 lb

## 2015-10-24 DIAGNOSIS — B349 Viral infection, unspecified: Secondary | ICD-10-CM

## 2015-10-24 DIAGNOSIS — R070 Pain in throat: Secondary | ICD-10-CM

## 2015-10-24 DIAGNOSIS — R6889 Other general symptoms and signs: Secondary | ICD-10-CM | POA: Diagnosis not present

## 2015-10-24 LAB — POCT INFLUENZA A/B
INFLUENZA B, POC: NEGATIVE
Influenza A, POC: NEGATIVE

## 2015-10-24 LAB — POCT RAPID STREP A (OFFICE): Rapid Strep A Screen: NEGATIVE

## 2015-10-24 NOTE — Patient Instructions (Addendum)
This is likely a virus.  Please use tylenol and nasal saline spray for your symptoms.  Please let me know if you have no improvement in the next 7 days.  Upper Respiratory Infection, Adult Most upper respiratory infections (URIs) are a viral infection of the air passages leading to the lungs. A URI affects the nose, throat, and upper air passages. The most common type of URI is nasopharyngitis and is typically referred to as "the common cold." URIs run their course and usually go away on their own. Most of the time, a URI does not require medical attention, but sometimes a bacterial infection in the upper airways can follow a viral infection. This is called a secondary infection. Sinus and middle ear infections are common types of secondary upper respiratory infections. Bacterial pneumonia can also complicate a URI. A URI can worsen asthma and chronic obstructive pulmonary disease (COPD). Sometimes, these complications can require emergency medical care and may be life threatening.  CAUSES Almost all URIs are caused by viruses. A virus is a type of germ and can spread from one person to another.  RISKS FACTORS You may be at risk for a URI if:   You smoke.   You have chronic heart or lung disease.  You have a weakened defense (immune) system.   You are very young or very old.   You have nasal allergies or asthma.  You work in crowded or poorly ventilated areas.  You work in health care facilities or schools. SIGNS AND SYMPTOMS  Symptoms typically develop 2-3 days after you come in contact with a cold virus. Most viral URIs last 7-10 days. However, viral URIs from the influenza virus (flu virus) can last 14-18 days and are typically more severe. Symptoms may include:   Runny or stuffy (congested) nose.   Sneezing.   Cough.   Sore throat.   Headache.   Fatigue.   Fever.   Loss of appetite.   Pain in your forehead, behind your eyes, and over your cheekbones (sinus  pain).  Muscle aches.  DIAGNOSIS  Your health care provider may diagnose a URI by:  Physical exam.  Tests to check that your symptoms are not due to another condition such as:  Strep throat.  Sinusitis.  Pneumonia.  Asthma. TREATMENT  A URI goes away on its own with time. It cannot be cured with medicines, but medicines may be prescribed or recommended to relieve symptoms. Medicines may help:  Reduce your fever.  Reduce your cough.  Relieve nasal congestion. HOME CARE INSTRUCTIONS   Take medicines only as directed by your health care provider.   Gargle warm saltwater or take cough drops to comfort your throat as directed by your health care provider.  Use a warm mist humidifier or inhale steam from a shower to increase air moisture. This may make it easier to breathe.  Drink enough fluid to keep your urine clear or pale yellow.   Eat soups and other clear broths and maintain good nutrition.   Rest as needed.   Return to work when your temperature has returned to normal or as your health care provider advises. You may need to stay home longer to avoid infecting others. You can also use a face mask and careful hand washing to prevent spread of the virus.  Increase the usage of your inhaler if you have asthma.   Do not use any tobacco products, including cigarettes, chewing tobacco, or electronic cigarettes. If you need help quitting, ask  your health care provider. PREVENTION  The best way to protect yourself from getting a cold is to practice good hygiene.   Avoid oral or hand contact with people with cold symptoms.   Wash your hands often if contact occurs.  There is no clear evidence that vitamin C, vitamin E, echinacea, or exercise reduces the chance of developing a cold. However, it is always recommended to get plenty of rest, exercise, and practice good nutrition.  SEEK MEDICAL CARE IF:   You are getting worse rather than better.   Your symptoms are  not controlled by medicine.   You have chills.  You have worsening shortness of breath.  You have Lope or red mucus.  You have yellow or Whitford nasal discharge.  You have pain in your face, especially when you bend forward.  You have a fever.  You have swollen neck glands.  You have pain while swallowing.  You have white areas in the back of your throat. SEEK IMMEDIATE MEDICAL CARE IF:   You have severe or persistent:  Headache.  Ear pain.  Sinus pain.  Chest pain.  You have chronic lung disease and any of the following:  Wheezing.  Prolonged cough.  Coughing up blood.  A change in your usual mucus.  You have a stiff neck.  You have changes in your:  Vision.  Hearing.  Thinking.  Mood. MAKE SURE YOU:   Understand these instructions.  Will watch your condition.  Will get help right away if you are not doing well or get worse.   This information is not intended to replace advice given to you by your health care provider. Make sure you discuss any questions you have with your health care provider.   Document Released: 12/18/2000 Document Revised: 11/08/2014 Document Reviewed: 09/29/2013 Elsevier Interactive Patient Education [employer]2016 Elsevier Inc.

## 2015-10-24 NOTE — Progress Notes (Signed)
Patient ID: Angela BernheimWesam Rivers, female    DOB: 07/28/1985  Age: 30 y.o. MRN: [id number]  Chief Complaint  Patient presents with  . Fever    comes and goes 100-102  . bodyaches  . Sore Throat  . Fatigue    Subjective:   26 week gravid female is here today for throat pain, body aches, and chills.  Patient states symptoms started yesterday with fever between 100-102.  She took anti-pyretic last night that helped, but has not had any today.  She has had some coughing that is non-productive.  No sob or dyspnea.  She had one episode of vomiting last night which resolved.  She also had one episode of diarrhea.  This too resolved from last night. She drinks very little water--about 1 bottle per week, she reports. Current allergies, medications, problem list, past/family and social histories reviewed.  Objective:  BP 96/60 mmHg  Pulse 113  Temp(Src) 98.2 F (36.8 C) (Oral)  Resp 16  Ht 5' 2.5" (1.588 m)  Wt 150 lb 3.2 oz (68.13 kg)  BMI 27.02 kg/m2  SpO2 99%  LMP 04/24/2015  Physical Exam  Constitutional: She appears healthy.  HENT: Tympanic membranes normal.  Nose: Nasal discharge present.  Mouth/Throat: Dentition is normal. Oropharynx is clear. Pharynx is normal (tonsillar stone like appearance).  Eyes: Conjunctivae are normal. Pupils are equal, round, and reactive to light.  Neck: Normal range of motion. Neck supple.  Pulmonary/Chest: Effort normal and breath sounds normal. She has no wheezes.  Abdominal: Soft. Bowel sounds are normal.  Neurological: She is alert and oriented to person, place, and time.  Skin: Skin is warm and dry.     Assessment & Plan:   Assessment: 30 year old female is here today with fever, bodyaches, and chills.  This appears to be viral at this time.  I have encouraged her to drink much more water.  She will do nasal saline sprays and tylenols.  I have advised her to rtc in 1 week if her symptoms continue.  Advised of alarming symptoms to warrant an immediate  return. Viral infection  Flu-like symptoms - Plan: POCT Influenza A/B  Throat pain - Plan: POCT rapid strep A, Culture, Group A Strep  Trena PlattStephanie English, PA-C [facility] 4/18/20177:07 PM

## 2015-10-26 LAB — CULTURE, GROUP A STREP: Organism ID, Bacteria: NORMAL

## 2016-01-10 LAB — OB RESULTS CONSOLE GBS: GBS: NEGATIVE

## 2016-01-21 ENCOUNTER — Inpatient Hospital Stay (HOSPITAL_COMMUNITY)
Admission: AD | Admit: 2016-01-21 | Discharge: 2016-01-24 | DRG: 775 | Disposition: A | Discharge status: Home / Self Care | Payer: PPO | Source: Ambulatory Visit | Attending: Obstetrics and Gynecology | Admitting: Obstetrics and Gynecology

## 2016-01-21 ENCOUNTER — Encounter (HOSPITAL_COMMUNITY): Payer: Self-pay | Admitting: *Deleted

## 2016-01-21 DIAGNOSIS — O4202 Full-term premature rupture of membranes, onset of labor within 24 hours of rupture: Principal | ICD-10-CM | POA: Diagnosis present

## 2016-01-21 DIAGNOSIS — Z833 Family history of diabetes mellitus: Secondary | ICD-10-CM

## 2016-01-21 DIAGNOSIS — O9902 Anemia complicating childbirth: Secondary | ICD-10-CM | POA: Diagnosis present

## 2016-01-21 DIAGNOSIS — Z3A38 38 weeks gestation of pregnancy: Secondary | ICD-10-CM

## 2016-01-21 DIAGNOSIS — D649 Anemia, unspecified: Secondary | ICD-10-CM | POA: Diagnosis present

## 2016-01-21 HISTORY — DX: Other specified health status: Z78.9

## 2016-01-21 LAB — POCT FERN TEST: POCT FERN TEST: POSITIVE

## 2016-01-21 NOTE — MAU Note (Signed)
Pt reports ROM at 2200,

## 2016-01-22 ENCOUNTER — Encounter (HOSPITAL_COMMUNITY): Payer: Self-pay | Admitting: *Deleted

## 2016-01-22 DIAGNOSIS — Z3A38 38 weeks gestation of pregnancy: Secondary | ICD-10-CM | POA: Diagnosis not present

## 2016-01-22 DIAGNOSIS — O9902 Anemia complicating childbirth: Secondary | ICD-10-CM | POA: Diagnosis present

## 2016-01-22 DIAGNOSIS — D649 Anemia, unspecified: Secondary | ICD-10-CM | POA: Diagnosis present

## 2016-01-22 DIAGNOSIS — O4202 Full-term premature rupture of membranes, onset of labor within 24 hours of rupture: Secondary | ICD-10-CM | POA: Diagnosis present

## 2016-01-22 DIAGNOSIS — Z833 Family history of diabetes mellitus: Secondary | ICD-10-CM | POA: Diagnosis not present

## 2016-01-22 LAB — CBC
HCT: 30.4 % — ABNORMAL LOW (ref 36.0–46.0)
Hemoglobin: 10.5 g/dL — ABNORMAL LOW (ref 12.0–15.0)
MCH: 28.4 pg (ref 26.0–34.0)
MCHC: 34.5 g/dL (ref 30.0–36.0)
MCV: 82.2 fL (ref 78.0–100.0)
PLATELETS: 257 10*3/uL (ref 150–400)
RBC: 3.7 MIL/uL — ABNORMAL LOW (ref 3.87–5.11)
RDW: 15 % (ref 11.5–15.5)
WBC: 6.7 10*3/uL (ref 4.0–10.5)

## 2016-01-22 LAB — TYPE AND SCREEN
ABO/RH(D): O POS
ANTIBODY SCREEN: NEGATIVE

## 2016-01-22 LAB — ABO/RH: ABO/RH(D): O POS

## 2016-01-22 MED ORDER — SENNOSIDES-DOCUSATE SODIUM 8.6-50 MG PO TABS
2.0000 | ORAL_TABLET | ORAL | Status: DC
  Administered 2016-01-23 (×2): 2 via ORAL
  Filled 2016-01-22 (×2): qty 2

## 2016-01-22 MED ORDER — DIPHENHYDRAMINE HCL 50 MG/ML IJ SOLN: 12.5000 mg | INTRAMUSCULAR | Status: DC | PRN

## 2016-01-22 MED ORDER — ONDANSETRON HCL 4 MG PO TABS: 4.0000 mg | ORAL_TABLET | ORAL | Status: DC | PRN

## 2016-01-22 MED ORDER — OXYCODONE-ACETAMINOPHEN 5-325 MG PO TABS
2.0000 | ORAL_TABLET | ORAL | Status: DC | PRN
Start: 2016-01-22 — End: 2016-01-22

## 2016-01-22 MED ORDER — PHENYLEPHRINE 40 MCG/ML (10ML) SYRINGE FOR IV PUSH (FOR BLOOD PRESSURE SUPPORT)
80.0000 ug | PREFILLED_SYRINGE | INTRAVENOUS | Status: DC | PRN
  Filled 2016-01-22: qty 5

## 2016-01-22 MED ORDER — IBUPROFEN 600 MG PO TABS
600.0000 mg | ORAL_TABLET | Freq: Four times a day (QID) | ORAL | Status: DC
  Administered 2016-01-22 – 2016-01-24 (×9): 600 mg via ORAL
  Filled 2016-01-22 (×9): qty 1

## 2016-01-22 MED ORDER — BUTORPHANOL TARTRATE 2 MG/ML IJ SOLN
2.0000 mg | INTRAMUSCULAR | Status: DC | PRN
  Administered 2016-01-22: 2 mg via INTRAVENOUS
  Filled 2016-01-22: qty 2

## 2016-01-22 MED ORDER — LACTATED RINGERS IV SOLN: 500.0000 mL | Freq: Once | INTRAVENOUS | Status: DC

## 2016-01-22 MED ORDER — LACTATED RINGERS IV SOLN
500.0000 mL | INTRAVENOUS | Status: DC | PRN
  Administered 2016-01-22: 500 mL via INTRAVENOUS

## 2016-01-22 MED ORDER — TERBUTALINE SULFATE 1 MG/ML IJ SOLN
0.2500 mg | Freq: Once | INTRAMUSCULAR | Status: DC | PRN
  Filled 2016-01-22: qty 1

## 2016-01-22 MED ORDER — ZOLPIDEM TARTRATE 5 MG PO TABS: 5.0000 mg | ORAL_TABLET | Freq: Every evening | ORAL | Status: DC | PRN

## 2016-01-22 MED ORDER — ACETAMINOPHEN 325 MG PO TABS: 650.0000 mg | ORAL_TABLET | ORAL | Status: DC | PRN

## 2016-01-22 MED ORDER — EPHEDRINE 5 MG/ML INJ
10.0000 mg | INTRAVENOUS | Status: DC | PRN
  Filled 2016-01-22: qty 2

## 2016-01-22 MED ORDER — OXYCODONE-ACETAMINOPHEN 5-325 MG PO TABS: 1.0000 | ORAL_TABLET | ORAL | Status: DC | PRN

## 2016-01-22 MED ORDER — LACTATED RINGERS IV SOLN
INTRAVENOUS | Status: DC
  Administered 2016-01-22 (×2): via INTRAVENOUS

## 2016-01-22 MED ORDER — OXYTOCIN BOLUS FROM INFUSION
500.0000 mL | INTRAVENOUS | Status: DC
  Administered 2016-01-22: 500 mL via INTRAVENOUS

## 2016-01-22 MED ORDER — SIMETHICONE 80 MG PO CHEW: 80.0000 mg | CHEWABLE_TABLET | ORAL | Status: DC | PRN

## 2016-01-22 MED ORDER — COCONUT OIL OIL
1.0000 "application " | TOPICAL_OIL | Status: DC | PRN
  Administered 2016-01-23: 1 via TOPICAL
  Filled 2016-01-22: qty 120

## 2016-01-22 MED ORDER — DIBUCAINE 1 % RE OINT: 1.0000 "application " | TOPICAL_OINTMENT | RECTAL | Status: DC | PRN

## 2016-01-22 MED ORDER — FERROUS SULFATE 325 (65 FE) MG PO TABS
325.0000 mg | ORAL_TABLET | Freq: Two times a day (BID) | ORAL | Status: DC
  Administered 2016-01-22 – 2016-01-24 (×4): 325 mg via ORAL
  Filled 2016-01-22 (×4): qty 1

## 2016-01-22 MED ORDER — FLEET ENEMA 7-19 GM/118ML RE ENEM: 1.0000 | ENEMA | RECTAL | Status: DC | PRN

## 2016-01-22 MED ORDER — OXYTOCIN 40 UNITS IN LACTATED RINGERS INFUSION - SIMPLE MED
2.5000 [IU]/h | INTRAVENOUS | Status: DC
  Filled 2016-01-22: qty 1000

## 2016-01-22 MED ORDER — PRENATAL MULTIVITAMIN CH
1.0000 | ORAL_TABLET | Freq: Every day | ORAL | Status: DC
  Administered 2016-01-23 – 2016-01-24 (×2): 1 via ORAL
  Filled 2016-01-22 (×2): qty 1

## 2016-01-22 MED ORDER — ONDANSETRON HCL 4 MG/2ML IJ SOLN: 4.0000 mg | INTRAMUSCULAR | Status: DC | PRN

## 2016-01-22 MED ORDER — BENZOCAINE-MENTHOL 20-0.5 % EX AERO: 1.0000 "application " | INHALATION_SPRAY | CUTANEOUS | Status: DC | PRN

## 2016-01-22 MED ORDER — ONDANSETRON HCL 4 MG/2ML IJ SOLN: 4.0000 mg | Freq: Four times a day (QID) | INTRAMUSCULAR | Status: DC | PRN

## 2016-01-22 MED ORDER — OXYTOCIN 40 UNITS IN LACTATED RINGERS INFUSION - SIMPLE MED
1.0000 m[IU]/min | INTRAVENOUS | Status: DC
  Administered 2016-01-22: 2 m[IU]/min via INTRAVENOUS

## 2016-01-22 MED ORDER — LIDOCAINE HCL (PF) 1 % IJ SOLN
30.0000 mL | INTRAMUSCULAR | Status: DC | PRN
  Administered 2016-01-22: 30 mL via SUBCUTANEOUS
  Filled 2016-01-22: qty 30

## 2016-01-22 MED ORDER — SOD CITRATE-CITRIC ACID 500-334 MG/5ML PO SOLN
30.0000 mL | ORAL | Status: DC | PRN
  Administered 2016-01-22: 30 mL via ORAL
  Filled 2016-01-22: qty 15

## 2016-01-22 MED ORDER — DIPHENHYDRAMINE HCL 25 MG PO CAPS: 25.0000 mg | ORAL_CAPSULE | Freq: Four times a day (QID) | ORAL | Status: DC | PRN

## 2016-01-22 MED ORDER — FENTANYL 2.5 MCG/ML BUPIVACAINE 1/10 % EPIDURAL INFUSION (WH - ANES): 14.0000 mL/h | INTRAMUSCULAR | Status: DC | PRN

## 2016-01-22 MED ORDER — WITCH HAZEL-GLYCERIN EX PADS: 1.0000 "application " | MEDICATED_PAD | CUTANEOUS | Status: DC | PRN

## 2016-01-22 NOTE — Progress Notes (Signed)
Angela BernheimWesam Rivers is a 30 y.o. G2P1001 at 5558w0d by LMP admitted for rupture of membranes  Subjective: Pitocin 2 miu  Objective: BP 93/58 mmHg  Pulse 96  Temp(Src) 98.7 F (37.1 C) (Oral)  Resp 18  Ht 5\' 5"  (1.651 m)  Wt 68.493 kg (151 lb)  BMI 25.13 kg/m2  SpO2 98%  LMP 04/24/2015      FHT:  FHR: 145 bpm, variability: moderate,  accelerations:  Present,  decelerations:  Absent UC:   irregular, every 2-4 minutes SVE:   4 cm dilated, 40%effaced, -3 station deviated to left. asynclitic Tracing: cat 1  Labs: Lab Results  Component Value Date   WBC 6.7 01/22/2016   HGB 10.5* 01/22/2016   HCT 30.4* 01/22/2016   MCV 82.2 01/22/2016   PLT 257 01/22/2016    Assessment / Plan: SROM  IUP @ 39 wks Latent phase P) cont pitocin. Right exaggerated sims position. Analgesic prn Disc epidural.    Anticipated MOD:  NSVD  Angela Rivers A 01/22/2016, 9:41 AM

## 2016-01-22 NOTE — Lactation Note (Signed)
This note was copied from a baby's chart. Lactation Consultation Note  Patient Name: Angela Rivers FAOZH'YToday's Date: 01/22/2016 Reason for consult: Initial assessment Baby at 8 hr of life. Mom is very sleepy and may need more bf education. She did not bf her older child because she was in the NICU. She did pump to feed for 1 month. She was sleeping with baby in her arms laying on pillows. Discussed safe sleep. Placed baby in basinet at bedside and baby started to cry. Mom requested that she have baby back in arms. Report given to RN. Discussed baby behavior, feeding frequency, voids, wt loss, breast changes, and nipple care. Mom stated she can manually express and has spoon in room. Given lactation handouts. Aware of OP services and support group. She will call as needed.     Maternal Data Has patient been taught Hand Expression?: Yes Does the patient have breastfeeding experience prior to this delivery?: Yes  Feeding Feeding Type: Breast Fed Length of feed: 30 min  LATCH Score/Interventions                      Lactation Tools Discussed/Used WIC Program: No   Consult Status Consult Status: Follow-up Date: 01/23/16 Follow-up type: In-patient    Angela Rivers 01/22/2016, 9:12 PM

## 2016-01-22 NOTE — Anesthesia Pain Management Evaluation Note (Signed)
  CRNA Pain Management Visit Note  Patient: Magda BernheimWesam Varney, 30 y.o., female  "Hello I am a [facility] of the [facility]. We have an anesthesia team available at all times to provide care throughout the hospital, including epidural management and anesthesia for C-section. I don't know your plan for the delivery whether it a natural birth, water birth, IV sedation, nitrous supplementation, doula or epidural, but we want to meet your pain goals."   1.Was your pain managed to your expectations on prior hospitalizations?   Yes   2.What is your expectation for pain management during this hospitalization?     Labor support without medications and Epidural  3.How can we help you reach that goal? Pt did not have epidural with first delivery but may want one with this baby due to baby being larger.  Record the patient's initial score and the patient's pain goal.   Pain: 0  Pain Goal: pt unsure of goal number [facility] wants you to be able to say your pain was always managed very well.  Kayzen Kendzierski 01/22/2016

## 2016-01-22 NOTE — H&P (Signed)
Angela Rivers is a 30 y.o. female presenting with SROm clear fluid @ 10 pm. Mild ctx. GBS cx neg.  Maternal Medical History:  Reason for admission: Rupture of membranes.   Contractions: Frequency: irregular.   Perceived severity is mild.    Fetal activity: Perceived fetal activity is normal.    Prenatal complications: no prenatal complications   OB History    Gravida Para Term Preterm AB TAB SAB Ectopic Multiple Living   2 1 1       1      History reviewed. No pertinent past medical history. History reviewed. No pertinent past surgical history. Family History: family history includes Diabetes in her father, maternal grandfather, and maternal grandmother. Social History:  reports that she has never smoked. She has never used smokeless tobacco. She reports that she does not drink alcohol or use illicit drugs.   Prenatal Transfer Tool  Maternal Diabetes: No Genetic Screening: Declined Maternal Ultrasounds/Referrals: Normal Fetal Ultrasounds or other Referrals:  None Maternal Substance Abuse:  No Significant Maternal Medications:  None Significant Maternal Lab Results:  Lab values include: Group B Strep negative Other Comments:  None  Review of Systems  All other systems reviewed and are negative.   Dilation: 2 Effacement (%): 80 Station: -2 Exam by:: Weston,RN Blood pressure 104/62, pulse 84, temperature 98.4 F (36.9 C), temperature source Oral, resp. rate 16, last menstrual period 04/24/2015, SpO2 98 %. Exam Physical Exam  Constitutional: She is oriented to person, place, and time. She appears well-developed and well-nourished.  HENT:  Head: Atraumatic.  Neck: Neck supple.  Cardiovascular: Regular rhythm.   Respiratory: Breath sounds normal.  GI: Soft.  Musculoskeletal: She exhibits no edema.  Neurological: She is alert and oriented to person, place, and time.  Skin: Skin is warm and dry.    Prenatal labs: ABO, Rh: O/Positive/-- (01/09 0000) Antibody: Negative  (01/09 0000) Rubella: Immune (01/09 0000) RPR: Nonreactive (01/09 0000)  HBsAg: Negative (01/09 0000)  HIV: Non-reactive (01/09 0000)  GBS: Negative (07/05 0000)   Assessment/Plan: SROM IUP@ 38 6/7 weeks P) admit routine labs. Analgesic prn. Pitocin augmentation if no active labor by 7 am   Taras Rask A 01/22/2016, 12:40 AM

## 2016-01-22 NOTE — Progress Notes (Signed)
Patient assisted up to bathroom to void large amount in toilet and passed a golf ball sized clot in toilet. Instructed in peri care then assisted back to bed. Gait is steady with no dizziness noted. Pain scale is 2-3. Fundus is firm and one below the umbilicus with small rubra lochia.

## 2016-01-22 NOTE — Progress Notes (Signed)
Angela Rivers is a 30 y.o. G2P1001 at 26102w0d by LMP admitted for rupture of membranes  Subjective: No chief complaint on file. S/P stadol Loopy from med  Objective: BP 113/65 mmHg  Pulse 106  Temp(Src) 98.7 F (37.1 C) (Oral)  Resp 18  Ht 5\' 5"  (1.651 m)  Wt 68.493 kg (151 lb)  BMI 25.13 kg/m2  SpO2 98%  LMP 04/24/2015      FHT:  FHR: 150 bpm, variability: minimal ,  accelerations:  Abscent,  decelerations:  Present early decel UC:   regular, every 2 minutes SVE:   9 cm dilated, 90% effaced, +1 station   Labs: Lab Results  Component Value Date   WBC 6.7 01/22/2016   HGB 10.5* 01/22/2016   HCT 30.4* 01/22/2016   MCV 82.2 01/22/2016   PLT 257 01/22/2016    Assessment / Plan: Augmentation of labor, progressing well SROM P)defer nitrous oxide due to stadol effect.   Anticipated MOD:  NSVD  Angela Rivers A 01/22/2016, 12:19 PM

## 2016-01-23 LAB — CBC
HCT: 29.2 % — ABNORMAL LOW (ref 36.0–46.0)
Hemoglobin: 9.6 g/dL — ABNORMAL LOW (ref 12.0–15.0)
MCH: 26.9 pg (ref 26.0–34.0)
MCHC: 32.9 g/dL (ref 30.0–36.0)
MCV: 81.8 fL (ref 78.0–100.0)
Platelets: 252 10*3/uL (ref 150–400)
RBC: 3.57 MIL/uL — ABNORMAL LOW (ref 3.87–5.11)
RDW: 15 % (ref 11.5–15.5)
WBC: 10.1 10*3/uL (ref 4.0–10.5)

## 2016-01-23 LAB — RPR: RPR: NONREACTIVE

## 2016-01-23 NOTE — Progress Notes (Signed)
Patient and husband educated to place neonate in open crib when patient is sleeping and not to co-bed for safety here and at home. Patient and husband acknowledges these instructions.

## 2016-01-23 NOTE — Progress Notes (Signed)
Patient ID: Angela Rivers, female   DOB: Jun 30, 1986, 30 y.o.   MRN: [id number] PPD # 1 SVD  S:  Reports feeling well.             Tolerating po/ No nausea or vomiting             Bleeding is light             Pain controlled with ibuprofen (OTC)             Up ad lib / ambulatory / voiding without difficulties    Newborn  Information for the patient's newborn:  Glade Nurselqarafi, Boy Dimitri [[id number]][[id number]]  female  breast feeding  / Circumcision planning   O:  A & O x 3, in no apparent distress              VS:  Filed Vitals:   01/22/16 1415 01/22/16 1500 01/22/16 1920 01/23/16 0700  BP: 98/70 93/60 97/57  91/59  Pulse: 90 92 98 90  Temp: 98.2 F (36.8 C) 98.4 F (36.9 C) 98.8 F (37.1 C) 97.7 F (36.5 C)  TempSrc: Oral Oral Oral   Resp: 20 16 18 18   Height:      Weight:      SpO2:   100%     LABS:  Recent Labs  01/22/16 0027 01/23/16 0557  WBC 6.7 10.1  HGB 10.5* 9.6*  HCT 30.4* 29.2*  PLT 257 252    Blood type: O POS (07/17 0027)  Rubella: Immune (01/09 0000)   I&O: I/O last 3 completed shifts: In: -  Out: 300 [Blood:300]             Lungs: Clear and unlabored  Heart: regular rate and rhythm / no murmurs  Abdomen: soft, non-tender, non-distended             Fundus: firm, non-tender, U-1  Perineum: 2nd degree repair healing well, no edema  Lochia: scant  Extremities: No edema, no calf pain or tenderness, No Homans    A/P: PPD # 1  30 y.o., [id number]   Principal Problem:    Postpartum care following vaginal delivery (7/17)    Doing well - stable status  Routine post partum orders  Anticipate discharge tomorrow    Raelyn MoraAWSON, Zollie Clemence, M, MSN, CNM 01/23/2016, 10:23 AM

## 2016-01-23 NOTE — Lactation Note (Signed)
This note was copied from a baby's chart. Lactation Consultation Note  Patient Name: Angela Rivers OZHYQ'MToday's Date: 01/23/2016 Reason for consult: Follow-up assessment Baby at 27 hr of life. Mom reports baby is feeding well. She stated her R nipple is "cracked" and her L nipple is "fine". There was a small dark red area on the nipple face, give comfort gels, discusses ways to get a deeper latch, and reviewed nipple care. Discussed baby behavior, feeding frequency, voids, wt loss, and breast changes. She is aware of lactation services and support group. She will call as needed.    Maternal Data    Feeding Feeding Type: Breast Fed Length of feed:  (sleepy after circ)  LATCH Score/Interventions                      Lactation Tools Discussed/Used     Consult Status Consult Status: Follow-up Date: 01/24/16 Follow-up type: In-patient    Angela Rivers 01/23/2016, 4:17 PM

## 2016-01-24 DIAGNOSIS — O9902 Anemia complicating childbirth: Secondary | ICD-10-CM | POA: Diagnosis present

## 2016-01-24 MED ORDER — PRENATAL MULTIVITAMIN CH: 1.0000 | ORAL_TABLET | Freq: Every day | ORAL | Status: AC

## 2016-01-24 MED ORDER — FERROUS SULFATE 325 (65 FE) MG PO TABS: 325.0000 mg | ORAL_TABLET | Freq: Two times a day (BID) | ORAL | Status: DC

## 2016-01-24 MED ORDER — BENZOCAINE-MENTHOL 20-0.5 % EX AERO: 1.0000 "application " | INHALATION_SPRAY | CUTANEOUS | Status: DC | PRN

## 2016-01-24 MED ORDER — IBUPROFEN 600 MG PO TABS: 600.0000 mg | ORAL_TABLET | Freq: Four times a day (QID) | ORAL | Status: DC

## 2016-01-24 NOTE — Lactation Note (Signed)
This note was copied from a baby's chart. Lactation Consultation Note  Patient Name: Angela Rivers BOFBP'ZToday's Date: 01/24/2016 Reason for consult: Follow-up assessment   With this mom and term baby, now 6048 hours old. Mom used her PIS DEP and expressed 2 ml's transitional milk, which was fed to the baby in a slow flow nipple. He then fed by bottle and took 30 ml's of formula, and tolerated this well. I wrote out a plan for mom, to 1 - breast feed with cues, and attempt every 3 hours, for [id number] minutes. @ - bottle feed EBm and formula, if needed. # - pump with DEP, 15-30 minutes, and use this milk for next feeding. Mom is using 20 nipple shield, and demonstrated great technique at application. NS info sheet given to mom. Oral restriction resources given to mom. She states she has read information on this, prior to giving birth to this baby. Mom also has an o/p lactation appointment for next Wed, 7/26 at 9 am. Mom knows to call lactation as needed.    Maternal Data    Feeding Feeding Type: Bottle Fed - Formula Nipple Type: Slow - flow Length of feed: 30 min  LATCH Score/Interventions Latch: Grasps breast easily, tongue down, lips flanged, rhythmical sucking. (with 20 nipple shiled and tea cup hold) Intervention(s): Adjust position;Assist with latch;Breast compression  Audible Swallowing: Spontaneous and intermittent Intervention(s): Skin to skin;Hand expression Intervention(s): Skin to skin;Hand expression  Type of Nipple: Everted at rest and after stimulation  Comfort (Breast/Nipple): Filling, red/small blisters or bruises, mild/mod discomfort  Problem noted: Mild/Moderate discomfort Interventions (Filling): Massage Interventions  (Cracked/bleeding/bruising/blister): Expressed breast milk to nipple Interventions (Mild/moderate discomfort): Comfort gels;Hand expression  Hold (Positioning): Assistance needed to correctly position infant at breast and maintain latch. Intervention(s):  Breastfeeding basics reviewed;Support Pillows;Position options  LATCH Score: 8  Lactation Tools Discussed/Used     Consult Status Consult Status: Follow-up Date: 01/31/16 Follow-up type: Out-patient    [name], Kyzen Horn Anne 01/24/2016, 12:41 PM

## 2016-01-24 NOTE — Discharge Summary (Signed)
Obstetric Discharge Summary Reason for Admission: onset of labor and rupture of membranes Prenatal Procedures: ultrasound Intrapartum Procedures: spontaneous vaginal delivery Postpartum Procedures: none Complications-Operative and Postpartum: 2nd degree perineal laceration HEMOGLOBIN  Date Value Ref Range Status  01/23/2016 9.6* 12.0 - 15.0 g/dL Final   HCT  Date Value Ref Range Status  01/23/2016 29.2* 36.0 - 46.0 % Final    Physical Exam:  General: alert, cooperative and no distress Lochia: appropriate Uterine Fundus: firm Incision: healing well DVT Evaluation: No evidence of DVT seen on physical exam. No significant calf/ankle edema.  Discharge Diagnoses: Term Pregnancy-delivered, anemia of pregnancy  Discharge Information: Date: 01/24/2016 Activity: pelvic rest Diet: routine Medications: PNV, Ibuprofen and Iron Condition: stable Instructions: refer to practice specific booklet Discharge to: home Follow-up Information    Follow up with COUSINS,SHERONETTE A, MD. Schedule an appointment as soon as possible for a visit in 6 weeks.   Specialty:  Obstetrics and Gynecology   Why:  For Postpartum follow-up   Contact information:   [address] [place] [address] [phone]       Newborn Data: Live born female, circ completed Birth Weight: 7 lb 11.1 oz (3490 g) APGAR: 8, 9  Home with mother.  Neta MendsDaniela C Alan Riles 01/24/2016, 9:24 AM

## 2016-01-24 NOTE — Lactation Note (Signed)
This note was copied from a baby's chart. Lactation Consultation Note  Patient Name: Angela Rivers XLKGM'WToday's Date: 01/24/2016 Reason for consult: Follow-up assessment  With this mom and term baby, now 8545 hours old. Mom is experiencing severe biting  and nipple pain. On exam of baby's mouth, he has an upper lip frenulum that extends to the gum line, and a  Lingual posterior tight , thick frenulum,with a severe cup shape with elevation. He cannot lift his tongue more than half way to his palate, and can not extend his tongue past the gum lin. I told mom his tongue is not protecting her breasts/nipple, and that I would speak to the doctor. I did speak to Dr Ricci BarkerEttifaugh, and explained above, plus that with a nipple shled, mom seems more comfortable. I will work with mom later with application of shiled, and will make an o/p lactation appointment.    Maternal Data    Feeding Feeding Type: Breast Fed Length of feed: 30 min  LATCH Score/Interventions Latch: Grasps breast easily, tongue down, lips flanged, rhythmical sucking. (with 20 nipple shiled and tea cup hold) Intervention(s): Adjust position;Assist with latch;Breast compression  Audible Swallowing: Spontaneous and intermittent Intervention(s): Skin to skin;Hand expression Intervention(s): Skin to skin;Hand expression  Type of Nipple: Everted at rest and after stimulation  Comfort (Breast/Nipple): Filling, red/small blisters or bruises, mild/mod discomfort  Problem noted: Mild/Moderate discomfort Interventions (Filling): Massage Interventions  (Cracked/bleeding/bruising/blister): Expressed breast milk to nipple Interventions (Mild/moderate discomfort): Comfort gels;Hand expression  Hold (Positioning): Assistance needed to correctly position infant at breast and maintain latch. Intervention(s): Breastfeeding basics reviewed;Support Pillows;Position options  LATCH Score: 8  Lactation Tools Discussed/Used     Consult  Status Consult Status: Follow-up Date: 01/24/16 Follow-up type: In-patient    [name], [name] 01/24/2016, 9:51 AM

## 2016-01-24 NOTE — Lactation Note (Addendum)
This note was copied from a baby's chart. Lactation Consultation Note Follow-up d/t weight loss 6% in 34 hrs. Baby has had 7 voids and 7 stools 1 emesis. BF well. Missed a few feeding d/t sleepy. Feedings going better at this time. Mom has large pendulum breast. Hand expressed colostrum. Mom wearing comfort gels for sore nipples. Has bra on that looks to small. Encouraged to wear a fitting supportive bra.  Patient Name: Angela Magda BernheimWesam Rivers ZOXWR'UToday's Date: 01/24/2016 Reason for consult: Follow-up assessment;Infant weight loss   Maternal Data    Feeding    LATCH Score/Interventions                      Lactation Tools Discussed/Used     Consult Status Consult Status: Follow-up Date: 01/24/16 (in pm) Follow-up type: In-patient    Kourtlyn Charlet, Diamond NickelLAURA G 01/24/2016, 3:50 AM

## 2016-01-24 NOTE — Progress Notes (Signed)
Post Partum Day #2            Information for the patient's newborn:  Angela Rivers, [place] [[id number]]  female 2  / circumcision done Feeding: breast, using shield and working w/ LC  Subjective: No HA, SOB, CP, F/C, breast symptoms. Pain controlled w/ PO medsding, no clots.      Objective:  Temp:  [98 F (36.7 C)-98.6 F (37 C)] 98 F (36.7 C) (07/19 0521) Pulse Rate:  [90-108] 90 (07/19 0521) Resp:  [20-22] 20 (07/19 0521) BP: (80-106)/(65-80) 80/65 mmHg (07/19 0521)  No intake or output data in the 24 hours ending 01/24/16 0925     Recent Labs  01/22/16 0027 01/23/16 0557  WBC 6.7 10.1  HGB 10.5* 9.6*  HCT 30.4* 29.2*  PLT 257 252    Blood type: --/--/O POS, O POS (07/17 0027) Rubella: Immune (01/09 0000)    Physical Exam:  General: alert, cooperative and no distress Uterine Fundus: firm Lochia: appropriate Perineum: repair intact, no edema DVT Evaluation: No cords or calf tenderness. No significant calf/ankle edema.    Assessment/Plan: PPD # 2 / 30 y.o., G2P2001 S/P:spontaneous vaginal   Principal Problem:   Postpartum care following vaginal delivery (7/17) Active Problems:   SVD (spontaneous vaginal delivery)   Anemia of mother in pregnancy, delivered - started oral Fe supplement, continue-6 wks PP   normal postpartum exam   LC support for breastfeeding  Continue current postpartum care  D/C home   LOS: 2 days   [name], CNM, MSN 01/24/2016, 9:25 AM

## 2016-01-31 ENCOUNTER — Ambulatory Visit (HOSPITAL_COMMUNITY)
Admit: 2016-01-31 | Discharge: 2016-01-31 | Disposition: A | Discharge status: Home / Self Care | Payer: PPO | Attending: Obstetrics and Gynecology | Admitting: Obstetrics and Gynecology

## 2016-01-31 NOTE — Lactation Note (Signed)
Lactation Consult for Angela Rivers (mother) and Angela Rivers (DOB: [birth year])  Mother's reason for visit: "facing some difficulties with breastfeeding" Consult:  Initial Lactation Consultant:  Remigio Eisenmenger  ________________________________________________________________________ BW: 3490g (7# 11.1oz) D/C wt: 7# 2.8oz (down 6.7%) Wt on 7-21: 7# 9oz (down 1.7%) Today's weight: 7# 13oz  ________________________________________________________________________  Mother's Name: Angela Rivers Type of delivery:  Vag Breastfeeding Experience: 1st time breastfeeding Maternal Medical Conditions:  None Maternal Medications: PNV, IB, Fe, stool softener  ________________________________________________________________________  Breastfeeding History (Post Discharge)  Frequency of breastfeeding: q2-3h Duration of feeding: 20 min (10 on each side)    Pumping  Type of pump:  Medela pump in style Frequency:  qd Volume: 154ml+  Bottle: 102mL of EBM at 0200 & 0430  Infant Intake and Output Assessment  Voids: 8-10 in 24 hrs.  Color:  Clear yellow Stools: 2-3 in 24 hrs.  Color:  Yellow  ________________________________________________________________________  Maternal Breast Assessment  Breast:  Compressible Nipple:  Flat   _______________________________________________________________________ Feeding Assessment/Evaluation  Initial feeding assessment:  Infant's oral assessment:  WNL   attached assessment:  Deep  Lips flanged:  Yes.     Tools:  Nipple shield 20 mm Instructed on use and cleaning of tool:  Yes.    Pre-feed weight: 3544g   Post-feed weight: 3568 g  Amount transferred: 24 ml R breast, 18 min, using nipple shield  Pre-feed weight: 3568g   Post-feed weight: 3582 g  Amount transferred: 33ml L breast;using NS,   Pre-feed weight: 3582 g   Post-feed weight:  3586 g  Amount transferred: 4 ml R breast w/NS   Total amount transferred: 42  ml (Mom had fed infant for 5 minutes prior to consult).  Infant is 29 days old & is almost 2 oz above BW.  He has been exclusively breastfed for the last 3 days. Mom gives 2 bottles of pumped EBM/day.   "Albara" latches w/ease. However, Mom has only been allowing him to feed 10 min on each breast b/c she had read to do that for the 1st 2 weeks (and then increasing to on each breast thereafter).  Education was done w/Mom about the fat content of milk. I explained to Mom the importance of "finishing the 1st breast first." I also explained that it is OK to return to the 1st breast after finishing the 2nd to increase milk intake.  Mom goes a long period at night without expressing her milk (infant receives bottles at that time). I cautioned her that doing that routinely could hurt her supply. Mom will consider changing the time she pumps and/or the time of bottle-feeds.   Mom is aware that Alimentum is not halal. She understands that regular Similac is halal. I gave her a website to refer to for American foods that shows their halal status.   Glenetta Hew, RN, IBCLC

## 2016-05-21 ENCOUNTER — Ambulatory Visit (INDEPENDENT_AMBULATORY_CARE_PROVIDER_SITE_OTHER): Payer: PPO

## 2016-05-21 ENCOUNTER — Ambulatory Visit (INDEPENDENT_AMBULATORY_CARE_PROVIDER_SITE_OTHER): Payer: PPO | Admitting: Physician Assistant

## 2016-05-21 VITALS — BP 110/68 | HR 95 | Temp 98.3°F | Ht 65.0 in | Wt 151.0 lb

## 2016-05-21 DIAGNOSIS — S91312A Laceration without foreign body, left foot, initial encounter: Secondary | ICD-10-CM

## 2016-05-21 MED ORDER — CEPHALEXIN 500 MG PO CAPS: 500.0000 mg | ORAL_CAPSULE | Freq: Two times a day (BID) | ORAL | 0 refills | Status: AC

## 2016-05-21 NOTE — Patient Instructions (Addendum)
Do not fill abx unless you begin to have redness, pus, or increasing pain from the wound.  Please come back in three days if you are not getting better.     IF you received an x-ray today, you will receive an invoice from [facility]. Please contact [facility] at [phone] with questions or concerns regarding your invoice.   IF you received labwork today, you will receive an invoice from [facility]. Please contact [facility] at [phone] with questions or concerns regarding your invoice.   Our billing staff will not be able to assist you with questions regarding bills from these companies.  You will be contacted with the lab results as soon as they are available. The fastest way to get your results is to activate your My Chart account. Instructions are located on the last page of this paperwork. If you have not heard from us regarding the results in 2 weeks, please contact this office.

## 2016-05-21 NOTE — Progress Notes (Signed)
05/21/2016 2:58 PM   DOB: May 30, 1986 / MRN: [id number]  SUBJECTIVE:  Angela Rivers is a 30 y.o. female presenting for foot pain after stepping on a piece of glass this morning.  Reports her daughter broke a piece of clear kitchen ware and she stepped on a shard.  States she was able to pull the shard out and it was roughly 1/8th of an inch.  Complains of pain and tenderness about the wound.  Denies exudate.    She has No Known Allergies.   She  has a past medical history of Medical history non-contributory.    She  reports that she has never smoked. She has never used smokeless tobacco. She reports that she does not drink alcohol or use drugs. She  reports that she currently engages in sexual activity. The patient  has a past surgical history that includes No past surgeries.  Her family history includes Diabetes in her father, maternal grandfather, and maternal grandmother.  Review of Systems  Musculoskeletal: Positive for myalgias. Negative for falls and joint pain.  Endo/Heme/Allergies: Does not bruise/bleed easily.    The problem list and medications were reviewed and updated by myself where necessary and exist elsewhere in the encounter.   OBJECTIVE:  BP 110/68 (BP Location: Right Arm, Patient Position: Sitting, Cuff Size: Small)   Pulse 95   Temp 98.3 F (36.8 C) (Oral)   Ht 5\' 5"  (1.651 m)   Wt 151 lb (68.5 kg)   SpO2 98%   BMI 25.13 kg/m   Physical Exam  Constitutional: She is oriented to person, place, and time. She appears well-nourished. No distress.  Eyes: EOM are normal. Pupils are equal, round, and reactive to light.  Cardiovascular: Normal rate.   Pulmonary/Chest: Effort normal.  Abdominal: She exhibits no distension.  Neurological: She is alert and oriented to person, place, and time. No cranial nerve deficit. Gait normal.  Skin: Skin is dry. She is not diaphoretic.  Psychiatric: She has a normal mood and affect.  Vitals reviewed.       No results  found for this or any previous visit (from the past 72 hour(s)).  Dg Foot 2 Views Left  Result Date: 05/21/2016 CLINICAL DATA:  89 y/o F; stepped on glass this morning, rule out foreign body. EXAM: LEFT FOOT - 2 VIEW COMPARISON:  None. FINDINGS: There is no evidence of fracture or dislocation. There is no evidence of arthropathy or other focal bone abnormality. No radiopaque foreign body is identified. IMPRESSION: No radiopaque foreign body is identified. Electronically Signed   By: Mitzi HansenLance  Furusawa-Stratton M.D.   On: 05/21/2016 14:43    ASSESSMENT AND PLAN  Angela Rivers was seen today for foot pain.  Diagnoses and all orders for this visit:  Laceration of left foot, initial encounter Comments: Rads neg for foreign body. See sig for keflex admin.  RTC in 2-3 days if not improving for exploration.  Orders: -     DG Foot 2 Views Left; Future -     cephALEXin (KEFLEX) 500 MG capsule; Take 1 capsule (500 mg total) by mouth 2 (two) times daily. Fill only if increasing pain, worsening redness or pus coming from the wound.    The patient is advised to call or return to clinic if she does not see an improvement in symptoms, or to seek the care of the closest emergency department if she worsens with the above plan.   Deliah BostonMichael Jamielynn Wigley, MHS, PA-C [facility] 05/21/2016 2:58 PM

## 2018-02-14 IMAGING — DX DG FOOT 2V*L*
2 series · 2 of 2 positions shown · non-contrast
Comparison: None.

CLINICAL DATA: 30 y/o F; stepped on glass this morning, rule out
foreign body.

EXAM:
LEFT FOOT - 2 VIEW

[foot ap]
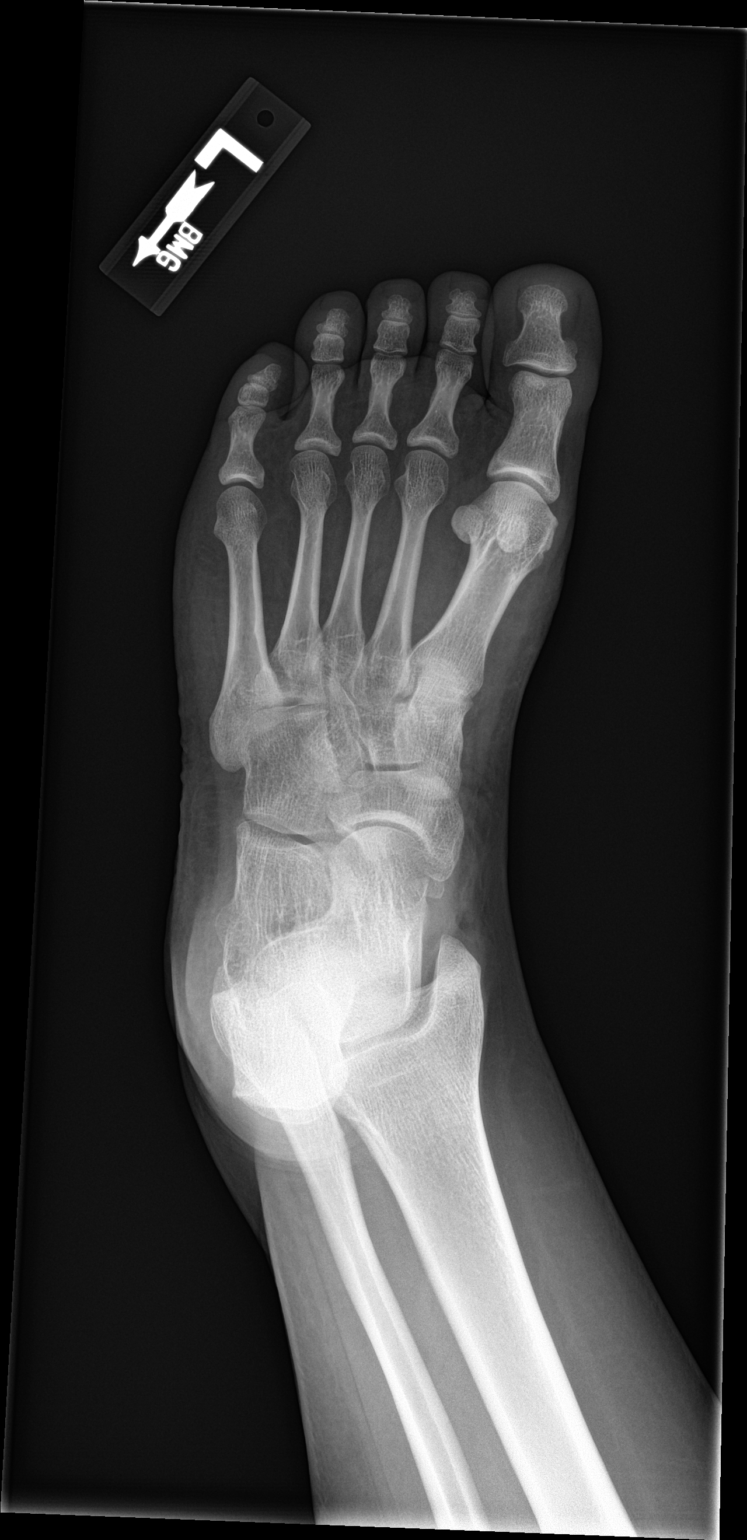

[foot lat]
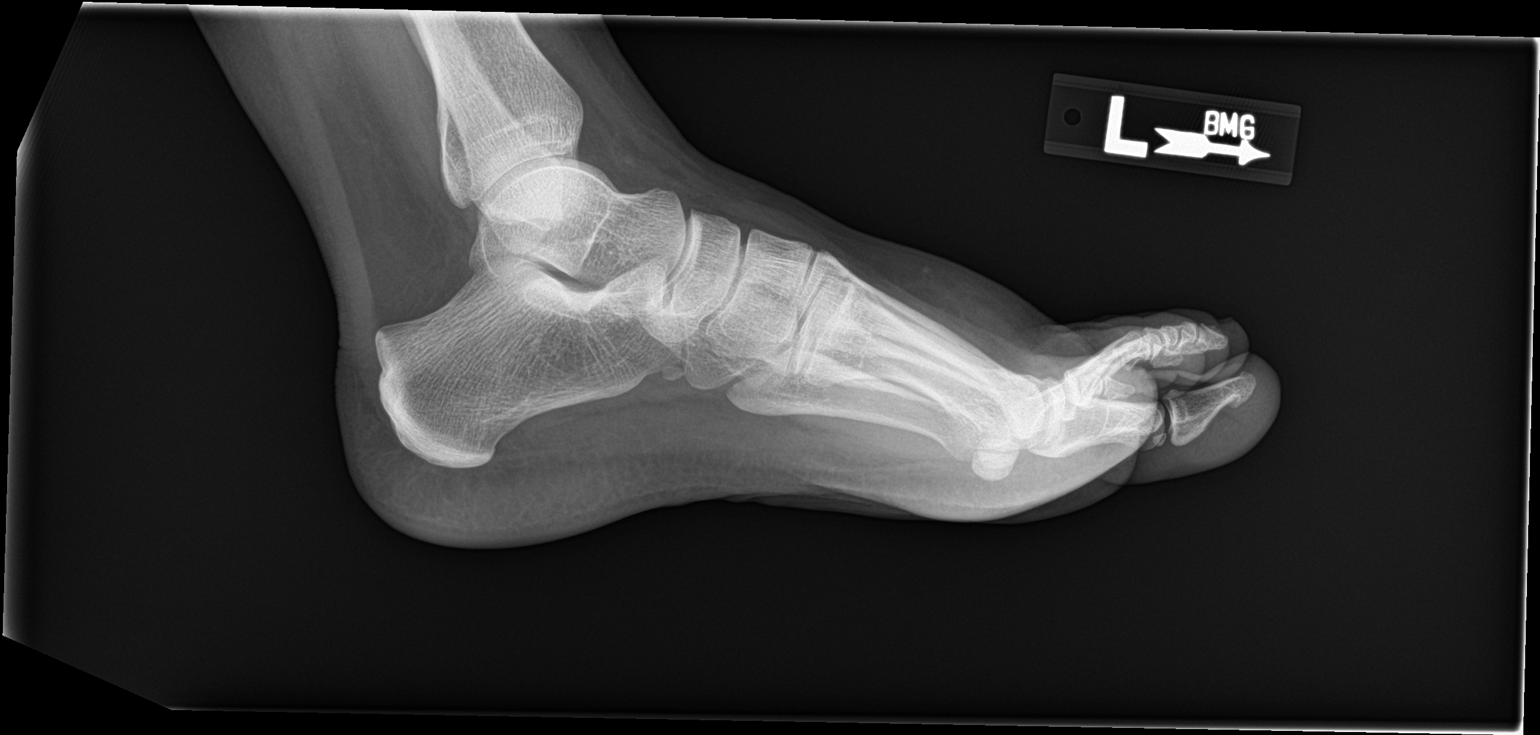

[2 of 2 positions shown; findings below may reference images not displayed]

FINDINGS: There is no evidence of fracture or dislocation. There is no
evidence of arthropathy or other focal bone abnormality. No
radiopaque foreign body is identified.
IMPRESSION: No radiopaque foreign body is identified.

By: Nonopha Laban M.D.
